# Patient Record
Sex: Female | Born: 1965
Health system: Southern US, Community
[De-identification: ages and names within clinical notes are randomized; demographics above are authoritative.]

## PROBLEM LIST (undated history)

## (undated) DIAGNOSIS — D649 Anemia, unspecified: Secondary | ICD-10-CM

## (undated) DIAGNOSIS — F419 Anxiety disorder, unspecified: Secondary | ICD-10-CM

## (undated) DIAGNOSIS — I10 Essential (primary) hypertension: Secondary | ICD-10-CM

## (undated) HISTORY — DX: Essential (primary) hypertension: I10

## (undated) HISTORY — PX: CHOLECYSTECTOMY: SHX55

## (undated) HISTORY — DX: Anxiety disorder, unspecified: F41.9

---
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
## 2005-04-06 ENCOUNTER — Ambulatory Visit: Payer: Self-pay | Admitting: Unknown Physician Specialty

## 2005-07-20 ENCOUNTER — Ambulatory Visit: Payer: Self-pay | Admitting: Family Medicine

## 2005-07-29 ENCOUNTER — Ambulatory Visit: Payer: Self-pay | Admitting: Urology

## 2006-06-26 DIAGNOSIS — G243 Spasmodic torticollis: Secondary | ICD-10-CM | POA: Insufficient documentation

## 2006-07-07 ENCOUNTER — Encounter: Payer: Self-pay | Admitting: Family Medicine

## 2006-07-14 ENCOUNTER — Ambulatory Visit: Payer: Self-pay | Admitting: Family Medicine

## 2006-07-16 ENCOUNTER — Encounter: Payer: Self-pay | Admitting: Family Medicine

## 2006-08-01 ENCOUNTER — Ambulatory Visit: Payer: Self-pay | Admitting: Unknown Physician Specialty

## 2007-08-14 ENCOUNTER — Ambulatory Visit: Payer: Self-pay | Admitting: Unknown Physician Specialty

## 2008-10-01 ENCOUNTER — Ambulatory Visit: Payer: Self-pay | Admitting: Unknown Physician Specialty

## 2009-10-05 ENCOUNTER — Ambulatory Visit: Payer: Self-pay | Admitting: Unknown Physician Specialty

## 2010-10-19 ENCOUNTER — Ambulatory Visit: Payer: Self-pay | Admitting: Unknown Physician Specialty

## 2012-08-08 LAB — TSH: TSH: 3.46 u[IU]/mL (ref 0.41–5.90)

## 2012-08-08 LAB — BASIC METABOLIC PANEL
BUN: 8 mg/dL (ref 4–21)
CREATININE: 0.9 mg/dL (ref 0.5–1.1)
GLUCOSE: 97 mg/dL
Potassium: 4.5 mmol/L (ref 3.4–5.3)
Sodium: 140 mmol/L (ref 137–147)

## 2012-08-08 LAB — LIPID PANEL
Cholesterol: 222 mg/dL — AB (ref 0–200)
HDL: 74 mg/dL — AB (ref 35–70)
LDL CALC: 130 mg/dL
TRIGLYCERIDES: 89 mg/dL (ref 40–160)

## 2012-08-08 LAB — CBC AND DIFFERENTIAL
HCT: 36 % (ref 36–46)
Hemoglobin: 12.2 g/dL (ref 12.0–16.0)
Platelets: 216 10*3/uL (ref 150–399)
WBC: 4 10^3/mL

## 2014-11-20 ENCOUNTER — Ambulatory Visit (INDEPENDENT_AMBULATORY_CARE_PROVIDER_SITE_OTHER): Payer: BLUE CROSS/BLUE SHIELD

## 2014-11-20 DIAGNOSIS — Z23 Encounter for immunization: Secondary | ICD-10-CM

## 2014-11-21 ENCOUNTER — Other Ambulatory Visit: Payer: Self-pay | Admitting: Family Medicine

## 2014-12-11 LAB — HM PAP SMEAR

## 2015-01-02 ENCOUNTER — Encounter: Payer: Self-pay | Admitting: Family Medicine

## 2015-01-05 DIAGNOSIS — F419 Anxiety disorder, unspecified: Secondary | ICD-10-CM | POA: Insufficient documentation

## 2015-01-05 DIAGNOSIS — F329 Major depressive disorder, single episode, unspecified: Secondary | ICD-10-CM | POA: Insufficient documentation

## 2015-01-05 DIAGNOSIS — D649 Anemia, unspecified: Secondary | ICD-10-CM | POA: Insufficient documentation

## 2015-01-05 DIAGNOSIS — F32A Depression, unspecified: Secondary | ICD-10-CM | POA: Insufficient documentation

## 2015-01-05 DIAGNOSIS — J309 Allergic rhinitis, unspecified: Secondary | ICD-10-CM | POA: Insufficient documentation

## 2015-01-06 ENCOUNTER — Ambulatory Visit (INDEPENDENT_AMBULATORY_CARE_PROVIDER_SITE_OTHER): Payer: BLUE CROSS/BLUE SHIELD | Admitting: Family Medicine

## 2015-01-06 ENCOUNTER — Encounter: Payer: Self-pay | Admitting: Family Medicine

## 2015-01-06 VITALS — BP 180/88 | HR 128 | Resp 18 | Wt 148.0 lb

## 2015-01-06 DIAGNOSIS — N951 Menopausal and female climacteric states: Secondary | ICD-10-CM | POA: Diagnosis not present

## 2015-01-06 DIAGNOSIS — F419 Anxiety disorder, unspecified: Secondary | ICD-10-CM

## 2015-01-06 DIAGNOSIS — G243 Spasmodic torticollis: Secondary | ICD-10-CM | POA: Diagnosis not present

## 2015-01-06 DIAGNOSIS — R000 Tachycardia, unspecified: Secondary | ICD-10-CM

## 2015-01-06 DIAGNOSIS — I10 Essential (primary) hypertension: Secondary | ICD-10-CM | POA: Diagnosis not present

## 2015-01-06 MED ORDER — CYCLOBENZAPRINE HCL 10 MG PO TABS: 10.0000 mg | ORAL_TABLET | Freq: Every day | ORAL | Status: DC

## 2015-01-06 MED ORDER — ALPRAZOLAM 0.25 MG PO TABS: 0.2500 mg | ORAL_TABLET | Freq: Two times a day (BID) | ORAL | Status: DC | PRN

## 2015-01-06 MED ORDER — PAROXETINE HCL 20 MG PO TABS: 20.0000 mg | ORAL_TABLET | Freq: Every day | ORAL | Status: DC

## 2015-01-06 NOTE — Progress Notes (Signed)
Patient ID: Catherine Herrera, female   DOB: 05-31-1965, 49 y.o.   MRN: [id number]    Subjective:  HPI  Patient is here for follow up. Last visit was October 2015.  Anxiety: Patient has been taking Xanax 1 tablet daily tries not to take 2 tablets. Symptoms are the same-anxiety, sometimes gets palpitations. Sleeping ok. She has been using Flexeril daily to help with next stiffness/tension. She wanted to discuss thyroid issues.  Hypertension: Patient gets elevated B/P readings when at the doctors office. She has been checking B/P and some of the readings have been as follows: 119/86, 142/80.131/86,122/78.  BP Readings from Last 3 Encounters:  01/06/15 180/88  11/19/13 158/84    Prior to Admission medications   Medication Sig Start Date End Date Taking? Authorizing Provider  ALPRAZolam Duanne Moron) 0.25 MG tablet Take by mouth. 05/28/14  Yes Historical Provider, MD  cyclobenzaprine (FLEXERIL) 10 MG tablet Take by mouth. 11/19/13  Yes Historical Provider, MD    Patient Active Problem List   Diagnosis Date Noted  . Absolute anemia 01/05/2015  . Anxiety 01/05/2015  . Allergic rhinitis 01/05/2015  . Clinical depression 01/05/2015  . Spasmodic torticollis 06/26/2006    No past medical history on file.  Social History   Social History  . Marital Status: Married    Spouse Name: N/A  . Number of Children: N/A  . Years of Education: N/A   Occupational History  . Not on file.   Social History Main Topics  . Smoking status: Never Smoker   . Smokeless tobacco: Never Used  . Alcohol Use: Yes  . Drug Use: No  . Sexual Activity: Not on file   Other Topics Concern  . Not on file   Social History Narrative    No Known Allergies  Review of Systems  Constitutional: Negative.   HENT: Negative.   Eyes: Negative.   Respiratory: Negative.   Cardiovascular: Positive for palpitations. Negative for chest pain, orthopnea and leg swelling.  Gastrointestinal: Negative.   Musculoskeletal:  Positive for myalgias and neck pain.  Skin: Negative.   Neurological: Negative.   Psychiatric/Behavioral: The patient is nervous/anxious.     Immunization History  Administered Date(s) Administered  . Influenza,inj,Quad PF,36+ Mos 11/20/2014  . Tdap 01/26/2012   Objective:  BP 180/88 mmHg  Pulse 128  Resp 18  Wt 148 lb (67.132 kg)  Physical Exam  Constitutional: She is oriented to person, place, and time and well-developed, well-nourished, and in no distress.  HENT:  Head: Normocephalic and atraumatic.  Eyes: Conjunctivae are normal. Pupils are equal, round, and reactive to light.  Neck: Neck supple. No thyromegaly present.  Cardiovascular: Normal rate, regular rhythm, normal heart sounds and intact distal pulses.   No murmur heard. Pulmonary/Chest: Effort normal and breath sounds normal. No respiratory distress. She has no wheezes. She has no rales.  Musculoskeletal: Normal range of motion. She exhibits no edema or tenderness.  Neurological: She is alert and oriented to person, place, and time.  Skin: Skin is warm and dry.  Psychiatric: Mood, memory, affect and judgment normal.    Lab Results  Component Value Date   WBC 4.0 08/08/2012   HGB 12.2 08/08/2012   HCT 36 08/08/2012   PLT 216 08/08/2012   CHOL 222* 08/08/2012   TRIG 89 08/08/2012   HDL 74* 08/08/2012   LDLCALC 130 08/08/2012   TSH 3.46 08/08/2012    CMP     Component Value Date/Time   NA 140 08/08/2012   K  4.5 08/08/2012   BUN 8 08/08/2012   CREATININE 0.9 08/08/2012    Assessment and Plan :  1. Anxiety Not better. Refill provided. Will add Paroxetine. Re check on the next visit. - ALPRAZolam (XANAX) 0.25 MG tablet; Take 1 tablet (0.25 mg total) by mouth 2 (two) times daily as needed for anxiety.  Dispense: 60 tablet; Refill: 5 - cyclobenzaprine (FLEXERIL) 10 MG tablet; Take 1 tablet (10 mg total) by mouth at bedtime.  Dispense: 30 tablet; Refill: 5 - CBC with Differential/Platelet - TSH -  Comprehensive metabolic panel  2. Spasmodic torticollis  3. Menopausal symptom Will check hormone levels. - Comprehensive metabolic panel - FSH - LH  4. Hypertension Seems to be "white coat hypertension". Patient brought in readings that she has gotten on her own-will re check readings on the next visit, I think this is anxiety related.  If this does not improve may need to add medication.   Patient was seen and examined by Dr. Eulas Post and note was scribed by Theressa Millard, RMA.   Miguel Aschoff MD [facility] 01/06/2015 3:51 PM

## 2015-01-07 LAB — COMPREHENSIVE METABOLIC PANEL
ALBUMIN: 4.3 g/dL (ref 3.5–5.5)
ALT: 8 IU/L (ref 0–32)
AST: 18 IU/L (ref 0–40)
Albumin/Globulin Ratio: 1.1 (ref 1.1–2.5)
Alkaline Phosphatase: 60 IU/L (ref 39–117)
BUN / CREAT RATIO: 11 (ref 9–23)
BUN: 10 mg/dL (ref 6–24)
Bilirubin Total: <0.2 mg/dL (ref 0.0–1.2)
CALCIUM: 9.4 mg/dL (ref 8.7–10.2)
CO2: 23 mmol/L (ref 18–29)
CREATININE: 0.91 mg/dL (ref 0.57–1.00)
Chloride: 99 mmol/L (ref 97–106)
GFR, EST AFRICAN AMERICAN: 85 mL/min/{1.73_m2} (ref >59)
GFR, EST NON AFRICAN AMERICAN: 74 mL/min/{1.73_m2} (ref >59)
GLOBULIN, TOTAL: 3.8 g/dL (ref 1.5–4.5)
Glucose: 107 mg/dL — ABNORMAL HIGH (ref 65–99)
Potassium: 3.8 mmol/L (ref 3.5–5.2)
SODIUM: 139 mmol/L (ref 136–144)
Total Protein: 8.1 g/dL (ref 6.0–8.5)

## 2015-01-07 LAB — CBC WITH DIFFERENTIAL/PLATELET
BASOS: 0 %
Basophils Absolute: 0 10*3/uL (ref 0.0–0.2)
EOS (ABSOLUTE): 0 10*3/uL (ref 0.0–0.4)
EOS: 1 %
HEMATOCRIT: 28.3 % — AB (ref 34.0–46.6)
Hemoglobin: 8.6 g/dL — ABNORMAL LOW (ref 11.1–15.9)
IMMATURE GRANS (ABS): 0 10*3/uL (ref 0.0–0.1)
IMMATURE GRANULOCYTES: 0 %
Lymphocytes Absolute: 1 10*3/uL (ref 0.7–3.1)
Lymphs: 18 %
MCH: 23.5 pg — ABNORMAL LOW (ref 26.6–33.0)
MCHC: 30.4 g/dL — ABNORMAL LOW (ref 31.5–35.7)
MCV: 77 fL — AB (ref 79–97)
MONOS ABS: 0.6 10*3/uL (ref 0.1–0.9)
Monocytes: 11 %
NEUTROS PCT: 70 %
Neutrophils Absolute: 3.8 10*3/uL (ref 1.4–7.0)
Platelets: 326 10*3/uL (ref 150–379)
RBC: 3.66 x10E6/uL — ABNORMAL LOW (ref 3.77–5.28)
RDW: 16.2 % — AB (ref 12.3–15.4)
WBC: 5.4 10*3/uL (ref 3.4–10.8)

## 2015-01-07 LAB — FOLLICLE STIMULATING HORMONE: FSH: 8.1 m[IU]/mL

## 2015-01-07 LAB — TSH: TSH: 3.82 u[IU]/mL (ref 0.450–4.500)

## 2015-01-07 LAB — LUTEINIZING HORMONE: LH: 6 m[IU]/mL

## 2015-01-12 ENCOUNTER — Telehealth: Payer: Self-pay

## 2015-01-12 NOTE — Telephone Encounter (Signed)
-----   Message from Jerrol Banana., MD sent at 01/07/2015 11:11 AM EST ----- Anemic. This patient still having menstrual cycles ? Keep follow-up in a couple weeks. Is patient having any GI bleeding that she notices. Any GI symptoms ?

## 2015-01-12 NOTE — Telephone Encounter (Signed)
Left message to call back  

## 2015-01-13 NOTE — Telephone Encounter (Signed)
Keep follow-up in December.

## 2015-01-13 NOTE — Telephone Encounter (Signed)
Pt advised-aa 

## 2015-01-13 NOTE — Telephone Encounter (Signed)
Patient reports that she does have heavy menstrual cycles. She reports that GYN has treated her anemia before in the past and started her on iron supplements. She reports that she has been off of supplements for years. Denies any GI bleeding or symptoms.

## 2015-01-26 ENCOUNTER — Ambulatory Visit (INDEPENDENT_AMBULATORY_CARE_PROVIDER_SITE_OTHER): Payer: BLUE CROSS/BLUE SHIELD | Admitting: Family Medicine

## 2015-01-26 VITALS — BP 160/88 | HR 100 | Temp 98.3°F | Resp 16 | Wt 146.0 lb

## 2015-01-26 DIAGNOSIS — I10 Essential (primary) hypertension: Secondary | ICD-10-CM | POA: Diagnosis not present

## 2015-01-26 DIAGNOSIS — F419 Anxiety disorder, unspecified: Secondary | ICD-10-CM

## 2015-01-26 DIAGNOSIS — D649 Anemia, unspecified: Secondary | ICD-10-CM

## 2015-01-26 MED ORDER — METOPROLOL SUCCINATE ER 25 MG PO TB24: 25.0000 mg | ORAL_TABLET | Freq: Every day | ORAL | Status: DC

## 2015-01-26 NOTE — Progress Notes (Signed)
Patient ID: Catherine Herrera, female   DOB: 1965/08/27, 49 y.o.   MRN: [id number]   FRANNY DEBAKER  MRN: [id number] DOB: 12/08/1965  Subjective:  HPI   1. Anxiety The patient is a 49 year old female who presents for follow up of her anxiety.  Her last visit was on 01/06/15.  At that time she was started on Paroxetine.  She states that she sometimes has trouble remembering to take it and on the nights that she does take is she does notice being drowsy the next day.  When asked if she has noticed any improvement in her symptoms she said maybe a little.    2. Essential hypertension Patient gets good reading outside of the office and on her last visit she was told she probably has some white coat component.  3. Anemia, unspecified anemia type When the patient was in the office in November she had labs done.  It was noted that she was anemic.  The patient states that she has had trouble with anemia for several years.  She said that she has been put on supplement in the past and then when the level comes up she is taken off of the supplements.  The patient does note that she has very heavy periods and thinks this is a contributing factor to why her levels keep going up and down.    Patient Active Problem List   Diagnosis Date Noted  . Absolute anemia 01/05/2015  . Anxiety 01/05/2015  . Allergic rhinitis 01/05/2015  . Clinical depression 01/05/2015  . Spasmodic torticollis 06/26/2006    No past medical history on file.  Social History   Social History  . Marital Status: Married    Spouse Name: N/A  . Number of Children: N/A  . Years of Education: N/A   Occupational History  . Not on file.   Social History Main Topics  . Smoking status: Never Smoker   . Smokeless tobacco: Never Used  . Alcohol Use: Yes  . Drug Use: No  . Sexual Activity: Not on file   Other Topics Concern  . Not on file   Social History Narrative    Outpatient Prescriptions Prior to Visit    Medication Sig Dispense Refill  . ALPRAZolam (XANAX) 0.25 MG tablet Take 1 tablet (0.25 mg total) by mouth 2 (two) times daily as needed for anxiety. 60 tablet 5  . cyclobenzaprine (FLEXERIL) 10 MG tablet Take 1 tablet (10 mg total) by mouth at bedtime. 30 tablet 5  . PARoxetine (PAXIL) 20 MG tablet Take 1 tablet (20 mg total) by mouth daily. 30 tablet 5   No facility-administered medications prior to visit.    No Known Allergies  Review of Systems  Constitutional: Negative for fever, chills and malaise/fatigue.  Respiratory: Negative for cough, hemoptysis, sputum production, shortness of breath and wheezing.   Cardiovascular: Negative for chest pain, palpitations, orthopnea and leg swelling.  Neurological: Negative for weakness and headaches.  Endo/Heme/Allergies: Does not bruise/bleed easily.  Psychiatric/Behavioral: Negative for depression, suicidal ideas, hallucinations and memory loss. The patient is nervous/anxious. The patient does not have insomnia.    Objective:  BP 160/88 mmHg  Pulse 100  Temp(Src) 98.3 F (36.8 C) (Oral)  Resp 16  Wt 146 lb (66.225 kg)  Physical Exam  Constitutional: She is oriented to person, place, and time and well-developed, well-nourished, and in no distress.  HENT:  Head: Normocephalic and atraumatic.  Right Ear: External ear normal.  Left Ear: External  ear normal.  Nose: Nose normal.  Mouth/Throat: Oropharynx is clear and moist.  Eyes: Conjunctivae are normal. Pupils are equal, round, and reactive to light.  Slightly pale conjuctiva.  Neck: Normal range of motion. Neck supple.  Very mild torticollis noted  Cardiovascular: Regular rhythm and normal heart sounds.   Tachycardic   Pulmonary/Chest: Effort normal and breath sounds normal.  Abdominal: Soft.  Neurological: She is alert and oriented to person, place, and time.  Skin: Skin is warm and dry.  Psychiatric: Mood, memory, affect and judgment normal.    Assessment and Plan :   1.  Anxiety Patient will continue her Paroxetine.  2. Essential hypertension After consideration will go ahead and treat for hypertension. - metoprolol succinate (TOPROL-XL) 25 MG 24 hr tablet; Take 1 tablet (25 mg total) by mouth daily.  Dispense: 30 tablet; Refill: 12 Follow up in 1 month  3. Anemia, unspecified anemia type I think this is due to menorrhagia but must consider a GI workup. At any rate, it is time for screening colonoscopy and 49 year old. - Ambulatory referral to Gastroenterology - CBC With Differential/Platelet - Iron - Iron Binding Cap (TIBC) - Ferritin 4. Chronic torticollis I have done the exam and reviewed the above chart and it is accurate to the best of my knowledge.  Miguel Aschoff MD [facility] 01/26/2015 3:12 PM

## 2015-01-29 LAB — CBC WITH DIFFERENTIAL/PLATELET

## 2015-01-29 LAB — FERRITIN: Ferritin: 5 ng/mL — ABNORMAL LOW (ref 15–150)

## 2015-01-29 LAB — IRON AND TIBC
IRON: 23 ug/dL — AB (ref 27–159)
Iron Saturation: 6 % — CL (ref 15–55)
Total Iron Binding Capacity: 398 ug/dL (ref 250–450)
UIBC: 375 ug/dL (ref 131–425)

## 2015-01-30 ENCOUNTER — Telehealth: Payer: Self-pay | Admitting: Gastroenterology

## 2015-01-30 NOTE — Telephone Encounter (Signed)
I have called pt to make an appointment per referral for GI--Anemia. No answer. I have left a message on voicemail.

## 2015-02-23 ENCOUNTER — Ambulatory Visit: Payer: BLUE CROSS/BLUE SHIELD | Admitting: Family Medicine

## 2015-03-04 ENCOUNTER — Ambulatory Visit (INDEPENDENT_AMBULATORY_CARE_PROVIDER_SITE_OTHER): Payer: BLUE CROSS/BLUE SHIELD | Admitting: Gastroenterology

## 2015-03-04 ENCOUNTER — Encounter: Payer: Self-pay | Admitting: Gastroenterology

## 2015-03-04 ENCOUNTER — Other Ambulatory Visit: Payer: Self-pay

## 2015-03-04 VITALS — BP 168/97 | HR 119 | Temp 98.8°F | Ht 66.0 in | Wt 148.0 lb

## 2015-03-04 DIAGNOSIS — D509 Iron deficiency anemia, unspecified: Secondary | ICD-10-CM

## 2015-03-04 NOTE — Progress Notes (Signed)
Gastroenterology Consultation  Referring Provider:     Jerrol Banana.,* Primary Care Physician:  [name], MD Primary Gastroenterologist:  Dr. Allen Norris     Reason for Consultation:     Anemia        HPI:   Catherine Herrera is a 50 y.o. y/o female referred for consultation & management of anemia by Dr. [name], MD.  This patient comes today with a history of anemia. She also reports that she has a family history of anemia. The patient has had peri-8. Throughout her life and it was a was thought that her anemia was due to this. The patient denies any abdominal pain black stools or bloody stools. She also denies any other GI symptoms except for having a history of alternating diarrhea and constipation the past. She states that this is been going on for many years and is not a change. The patient also reports that she has been on iron off and on with response from iron treatment. There is no family history of colon cancer colon polyps that the patient reports. The patient does have a history of recent blood work that shows a low iron count and low ferritin.  Past Medical History  Diagnosis Date  . Anxiety   . Hypertension     Past Surgical History  Procedure Laterality Date  . Cholecystectomy      Prior to Admission medications   Medication Sig Start Date End Date Taking? Authorizing Provider  ALPRAZolam (XANAX) 0.25 MG tablet Take 1 tablet (0.25 mg total) by mouth 2 (two) times daily as needed for anxiety. 01/06/15  Yes Richard Maceo Pro., MD  cyclobenzaprine (FLEXERIL) 10 MG tablet Take 1 tablet (10 mg total) by mouth at bedtime. 01/06/15  Yes Richard Maceo Pro., MD  Fe Fum-FePoly-FA-Vit C-Vit B3 (INTEGRA F) 125-1 MG CAPS Take by mouth.   Yes Historical Provider, MD  metoprolol succinate (TOPROL-XL) 25 MG 24 hr tablet Take 1 tablet (25 mg total) by mouth daily. 01/26/15  Yes Richard Maceo Pro., MD  PARoxetine (PAXIL) 20 MG tablet Take 1 tablet (20 mg  total) by mouth daily. Patient not taking: Reported on 03/04/2015 01/06/15   Jerrol Banana., MD    Family History  Problem Relation Age of Onset  . Hypertension Mother   . Hypothyroidism Mother   . Hypertension Father   . Prostate cancer Father   . Hyperthyroidism Sister   . Multiple sclerosis Maternal Grandmother   . Heart attack Maternal Grandfather   . Heart disease Maternal Grandfather   . Gallbladder disease Sister      Social History  Substance Use Topics  . Smoking status: Never Smoker   . Smokeless tobacco: Never Used  . Alcohol Use: Yes    Allergies as of 03/04/2015  . (No Known Allergies)    Review of Systems:    All systems reviewed and negative except where noted in HPI.   Physical Exam:  BP 168/97 mmHg  Pulse 119  Temp(Src) 98.8 F (37.1 C) (Oral)  Ht 5\' 6"  (1.676 m)  Wt 148 lb (67.132 kg)  BMI 23.90 kg/m2 No LMP recorded. Psych:  Alert and cooperative. Normal mood and affect. General:   Alert,  Well-developed, well-nourished, pleasant and cooperative in NAD Head:  Normocephalic and atraumatic. Eyes:  Sclera clear, no icterus.   Conjunctiva pink. Ears:  Normal auditory acuity. Nose:  No deformity, discharge, or lesions. Mouth:  No deformity  or lesions,oropharynx pink & moist. Neck:  Supple; no masses or thyromegaly. Lungs:  Respirations even and unlabored.  Clear throughout to auscultation.   No wheezes, crackles, or rhonchi. No acute distress. Heart:  Regular rate and rhythm; no murmurs, clicks, rubs, or gallops. Abdomen:  Normal bowel sounds.  No bruits.  Soft, non-tender and non-distended without masses, hepatosplenomegaly or hernias noted.  No guarding or rebound tenderness.  Negative Carnett sign.   Rectal:  Deferred.  Msk:  Symmetrical without gross deformities.  Good, equal movement & strength bilaterally. Pulses:  Normal pulses noted. Extremities:  No clubbing or edema.  No cyanosis. Neurologic:  Alert and oriented x3;  grossly normal  neurologically. Skin:  Intact without significant lesions or rashes.  No jaundice. Lymph Nodes:  No significant cervical adenopathy. Psych:  Alert and cooperative. Normal mood and affect.  Imaging Studies: No results found.  Assessment and Plan:   Catherine Herrera is a 50 y.o. y/o female who comes in today with a history of heavy periods during her cycle. The patient has had anemia from this in the past. At this time the patient will continue her iron treatment and she states that she is starting to go through menopause. I have instructed her that it is time for her screening colonoscopy and she should undergo a screening colonoscopy. If her anemia persists after menopause then she may need a further workup with an upper endoscopy and/or capsule endoscopy of the small bowel. As for now the patient will be set up for the screening colonoscopy. I have discussed risks & benefits which include, but are not limited to, bleeding, infection, perforation & drug reaction.  The patient agrees with this plan & written consent will be obtained.      Note: This dictation was prepared with Dragon dictation along with smaller phrase technology. Any transcriptional errors that result from this process are unintentional.

## 2015-03-09 ENCOUNTER — Ambulatory Visit: Payer: BLUE CROSS/BLUE SHIELD | Admitting: Gastroenterology

## 2015-03-19 NOTE — Discharge Instructions (Signed)

## 2015-03-23 ENCOUNTER — Ambulatory Visit: Payer: BLUE CROSS/BLUE SHIELD | Admitting: Anesthesiology

## 2015-03-23 ENCOUNTER — Other Ambulatory Visit: Payer: Self-pay | Admitting: Gastroenterology

## 2015-03-23 ENCOUNTER — Ambulatory Visit
Admission: RE | Admit: 2015-03-23 | Discharge: 2015-03-23 | Disposition: A | Discharge status: Home / Self Care | Payer: BLUE CROSS/BLUE SHIELD | Source: Ambulatory Visit | Attending: Gastroenterology | Admitting: Gastroenterology

## 2015-03-23 ENCOUNTER — Encounter
Admission: RE | Disposition: A | Discharge status: Home / Self Care | Payer: Self-pay | Source: Ambulatory Visit | Attending: Gastroenterology

## 2015-03-23 DIAGNOSIS — Z8249 Family history of ischemic heart disease and other diseases of the circulatory system: Secondary | ICD-10-CM | POA: Insufficient documentation

## 2015-03-23 DIAGNOSIS — Z1211 Encounter for screening for malignant neoplasm of colon: Secondary | ICD-10-CM | POA: Diagnosis present

## 2015-03-23 DIAGNOSIS — F419 Anxiety disorder, unspecified: Secondary | ICD-10-CM | POA: Insufficient documentation

## 2015-03-23 DIAGNOSIS — I10 Essential (primary) hypertension: Secondary | ICD-10-CM | POA: Diagnosis not present

## 2015-03-23 DIAGNOSIS — Z79899 Other long term (current) drug therapy: Secondary | ICD-10-CM | POA: Insufficient documentation

## 2015-03-23 DIAGNOSIS — Z820 Family history of epilepsy and other diseases of the nervous system: Secondary | ICD-10-CM | POA: Diagnosis not present

## 2015-03-23 DIAGNOSIS — D649 Anemia, unspecified: Secondary | ICD-10-CM | POA: Insufficient documentation

## 2015-03-23 DIAGNOSIS — Z8042 Family history of malignant neoplasm of prostate: Secondary | ICD-10-CM | POA: Insufficient documentation

## 2015-03-23 DIAGNOSIS — Z8349 Family history of other endocrine, nutritional and metabolic diseases: Secondary | ICD-10-CM | POA: Diagnosis not present

## 2015-03-23 DIAGNOSIS — D125 Benign neoplasm of sigmoid colon: Secondary | ICD-10-CM | POA: Diagnosis not present

## 2015-03-23 HISTORY — DX: Anemia, unspecified: D64.9

## 2015-03-23 HISTORY — PX: COLONOSCOPY WITH PROPOFOL: SHX5780

## 2015-03-23 HISTORY — PX: POLYPECTOMY: SHX5525

## 2015-03-23 SURGERY — COLONOSCOPY WITH PROPOFOL
Anesthesia: Monitor Anesthesia Care

## 2015-03-23 MED ORDER — ACETAMINOPHEN 325 MG PO TABS: 325.0000 mg | ORAL_TABLET | ORAL | Status: DC | PRN

## 2015-03-23 MED ORDER — ACETAMINOPHEN 160 MG/5ML PO SOLN: 325.0000 mg | ORAL | Status: DC | PRN

## 2015-03-23 MED ORDER — PROPOFOL 10 MG/ML IV BOLUS
INTRAVENOUS | Status: DC | PRN
  Administered 2015-03-23 (×8): 20 mg via INTRAVENOUS

## 2015-03-23 MED ORDER — LACTATED RINGERS IV SOLN
INTRAVENOUS | Status: DC
  Administered 2015-03-23: 09:00:00 via INTRAVENOUS

## 2015-03-23 MED ORDER — STERILE WATER FOR IRRIGATION IR SOLN
Status: DC | PRN
  Administered 2015-03-23: 09:00:00

## 2015-03-23 SURGICAL SUPPLY — 28 items

## 2015-03-23 NOTE — H&P (Signed)
  [facility]  [address], [place], [place] [address] Phone: [phone] Fax : [phone]  Primary Care Physician:  [name], MD Primary Gastroenterologist:  Dr. Allen Norris  Pre-Procedure History & Physical: HPI:  Catherine Herrera is a 50 y.o. female is here for a screening colonoscopy.   Past Medical History  Diagnosis Date  . Anxiety   . Hypertension   . Anemia     Past Surgical History  Procedure Laterality Date  . Cholecystectomy      Prior to Admission medications   Medication Sig Start Date End Date Taking? Authorizing Provider  ALPRAZolam (XANAX) 0.25 MG tablet Take 1 tablet (0.25 mg total) by mouth 2 (two) times daily as needed for anxiety. 01/06/15  Yes Richard Maceo Pro., MD  cyclobenzaprine (FLEXERIL) 10 MG tablet Take 1 tablet (10 mg total) by mouth at bedtime. 01/06/15  Yes Richard Maceo Pro., MD  Fe Fum-FePoly-FA-Vit C-Vit B3 (INTEGRA F) 125-1 MG CAPS Take by mouth.   Yes Historical Provider, MD  metoprolol succinate (TOPROL-XL) 25 MG 24 hr tablet Take 1 tablet (25 mg total) by mouth daily. 01/26/15  Yes Richard Maceo Pro., MD  PARoxetine (PAXIL) 20 MG tablet Take 1 tablet (20 mg total) by mouth daily. Patient not taking: Reported on 03/04/2015 01/06/15   Jerrol Banana., MD    Allergies as of 03/04/2015  . (No Known Allergies)    Family History  Problem Relation Age of Onset  . Hypertension Mother   . Hypothyroidism Mother   . Hypertension Father   . Prostate cancer Father   . Hyperthyroidism Sister   . Multiple sclerosis Maternal Grandmother   . Heart attack Maternal Grandfather   . Heart disease Maternal Grandfather   . Gallbladder disease Sister     Social History   Social History  . Marital Status: Married    Spouse Name: N/A  . Number of Children: N/A  . Years of Education: N/A   Occupational History  . Not on file.   Social History Main Topics  . Smoking status: Never Smoker   . Smokeless  tobacco: Never Used  . Alcohol Use: Yes  . Drug Use: No  . Sexual Activity: Not on file   Other Topics Concern  . Not on file   Social History Narrative    Review of Systems: See HPI, otherwise negative ROS  Physical Exam: BP 143/90 mmHg  Pulse 97  Temp(Src) 98.1 F (36.7 C) (Temporal)  Resp 16  Ht 5\' 6"  (1.676 m)  Wt 147 lb (66.679 kg)  BMI 23.74 kg/m2  SpO2 100%  LMP 01/09/2015 General:   Alert,  pleasant and cooperative in NAD Head:  Normocephalic and atraumatic. Neck:  Supple; no masses or thyromegaly. Lungs:  Clear throughout to auscultation.    Heart:  Regular rate and rhythm. Abdomen:  Soft, nontender and nondistended. Normal bowel sounds, without guarding, and without rebound.   Neurologic:  Alert and  oriented x4;  grossly normal neurologically.  Impression/Plan: [name] is now here to undergo a screening colonoscopy.  Risks, benefits, and alternatives regarding colonoscopy have been reviewed with the patient.  Questions have been answered.  All parties agreeable.

## 2015-03-23 NOTE — Transfer of Care (Signed)
Immediate Anesthesia Transfer of Care Note  Patient: Catherine Herrera  Procedure(s) Performed: Procedure(s): COLONOSCOPY WITH PROPOFOL (N/A) POLYPECTOMY  Patient Location: PACU  Anesthesia Type: MAC  Level of Consciousness: awake, alert  and patient cooperative  Airway and Oxygen Therapy: Patient Spontanous Breathing and Patient connected to supplemental oxygen  Post-op Assessment: Post-op Vital signs reviewed, Patient's Cardiovascular Status Stable, Respiratory Function Stable, Patent Airway and No signs of Nausea or vomiting  Post-op Vital Signs: Reviewed and stable  Complications: No apparent anesthesia complications

## 2015-03-23 NOTE — Anesthesia Postprocedure Evaluation (Signed)
Anesthesia Post Note  Patient: Catherine Herrera  Procedure(s) Performed: Procedure(s) (LRB): COLONOSCOPY WITH PROPOFOL (N/A) POLYPECTOMY  Patient location during evaluation: PACU Anesthesia Type: MAC Level of consciousness: awake and alert and oriented Pain management: satisfactory to patient Vital Signs Assessment: post-procedure vital signs reviewed and stable Respiratory status: spontaneous breathing, nonlabored ventilation and respiratory function stable Cardiovascular status: blood pressure returned to baseline and stable Postop Assessment: Adequate PO intake and No signs of nausea or vomiting Anesthetic complications: no    Raliegh Ip

## 2015-03-23 NOTE — Op Note (Signed)
[facility] Gastroenterology Patient Name: Catherine Herrera Procedure Date: 03/23/2015 9:13 AM MRN: [id number] Account #: [id number] Date of Birth: 11/01/1965 Admit Type: Outpatient Age: 50 Room: [facility] OR ROOM 01 Gender: Female Note Status: Finalized Procedure:         Colonoscopy Indications:       Screening for colorectal malignant neoplasm Providers:         Lucilla Lame, MD Referring MD:      Janine Ores. Rosanna Randy, MD (Referring MD) Medicines:         Propofol per Anesthesia Complications:     No immediate complications. Procedure:         Pre-Anesthesia Assessment:                    - Prior to the procedure, a History and Physical was                     performed, and patient medications and allergies were                     reviewed. The patient's tolerance of previous anesthesia                     was also reviewed. The risks and benefits of the procedure                     and the sedation options and risks were discussed with the                     patient. All questions were answered, and informed consent                     was obtained. Prior Anticoagulants: The patient has taken                     no previous anticoagulant or antiplatelet agents. ASA                     Grade Assessment: II - A patient with mild systemic                     disease. After reviewing the risks and benefits, the                     patient was deemed in satisfactory condition to undergo                     the procedure.                    After obtaining informed consent, the colonoscope was                     passed under direct vision. Throughout the procedure, the                     patient's blood pressure, pulse, and oxygen saturations                     were monitored continuously. The was introduced through                     the anus and advanced to the the terminal ileum. The  colonoscopy was performed without difficulty. The patient                    tolerated the procedure well. The quality of the bowel                     preparation was excellent. Findings:      The perianal and digital rectal examinations were normal.      Non-bleeding internal hemorrhoids were found during retroflexion. The       hemorrhoids were Grade I (internal hemorrhoids that do not prolapse).      Two sessile polyps were found in the sigmoid colon. The polyps were 2 to       3 mm in size. These polyps were removed with a cold biopsy forceps.       Resection and retrieval were complete.      The terminal ileum appeared normal. Impression:        - Non-bleeding internal hemorrhoids.                    - Two 2 to 3 mm polyps in the sigmoid colon. Resected and                     retrieved.                    - The examined portion of the ileum was normal. Recommendation:    - Await pathology results.                    - Repeat colonoscopy in 5 years if polyp adenoma and 10                     years if hyperplastic Procedure Code(s): --- Professional ---                    [phone], Colonoscopy, flexible; with biopsy, single or                     multiple Diagnosis Code(s): --- Professional ---                    Z12.11, Encounter for screening for malignant neoplasm of                     colon                    D12.5, Benign neoplasm of sigmoid colon CPT copyright 2014 [employer]. All rights reserved. The codes documented in this report are preliminary and upon coder review may  be revised to meet current compliance requirements. Lucilla Lame, MD 03/23/2015 9:43:32 AM This report has been signed electronically. Number of Addenda: 0 Note Initiated On: 03/23/2015 9:13 AM Scope Withdrawal Time: 0 hours 10 minutes 29 seconds  Total Procedure Duration: 0 hours 15 minutes 44 seconds       [facility]

## 2015-03-23 NOTE — Anesthesia Preprocedure Evaluation (Signed)
Anesthesia Evaluation  Patient identified by MRN, date of birth, ID band  Reviewed: Allergy & Precautions, H&P , NPO status , Patient's Chart, lab work & pertinent test results  Airway Mallampati: II  TM Distance: >3 FB Neck ROM: full    Dental no notable dental hx.    Pulmonary    Pulmonary exam normal       Cardiovascular hypertension, Rhythm:regular Rate:Normal     Neuro/Psych PSYCHIATRIC DISORDERS    GI/Hepatic   Endo/Other    Renal/GU      Musculoskeletal   Abdominal   Peds  Hematology   Anesthesia Other Findings   Reproductive/Obstetrics                             Anesthesia Physical Anesthesia Plan  ASA: II  Anesthesia Plan: MAC   Post-op Pain Management:    Induction:   Airway Management Planned:   Additional Equipment:   Intra-op Plan:   Post-operative Plan:   Informed Consent: I have reviewed the patients History and Physical, chart, labs and discussed the procedure including the risks, benefits and alternatives for the proposed anesthesia with the patient or authorized representative who has indicated his/her understanding and acceptance.     Plan Discussed with: CRNA  Anesthesia Plan Comments:         Anesthesia Quick Evaluation  

## 2015-03-23 NOTE — Anesthesia Procedure Notes (Signed)
Procedure Name: MAC Performed by: Cregg Jutte Pre-anesthesia Checklist: Patient identified, Emergency Drugs available, Suction available, Patient being monitored and Timeout performed Patient Re-evaluated:Patient Re-evaluated prior to inductionOxygen Delivery Method: Nasal cannula Preoxygenation: Pre-oxygenation with 100% oxygen     

## 2015-03-24 ENCOUNTER — Encounter: Payer: Self-pay | Admitting: Gastroenterology

## 2015-03-25 ENCOUNTER — Encounter: Payer: Self-pay | Admitting: Gastroenterology

## 2015-07-24 ENCOUNTER — Other Ambulatory Visit: Payer: Self-pay | Admitting: Family Medicine

## 2015-08-24 ENCOUNTER — Telehealth: Payer: Self-pay

## 2015-08-24 NOTE — Telephone Encounter (Signed)
Lmtcb, received nurse advice fax where patient called earlier when phones were not working complaining of hip pain, she was triaged. Just want to check on patient and see how she is doing, if appointment needs to be [place]

## 2015-12-14 ENCOUNTER — Other Ambulatory Visit: Payer: Self-pay | Admitting: Obstetrics and Gynecology

## 2015-12-14 ENCOUNTER — Ambulatory Visit
Admission: RE | Admit: 2015-12-14 | Discharge: 2015-12-14 | Disposition: A | Discharge status: Home / Self Care | Payer: BLUE CROSS/BLUE SHIELD | Source: Ambulatory Visit | Attending: Obstetrics and Gynecology | Admitting: Obstetrics and Gynecology

## 2015-12-14 DIAGNOSIS — Z1231 Encounter for screening mammogram for malignant neoplasm of breast: Secondary | ICD-10-CM | POA: Insufficient documentation

## 2016-01-12 ENCOUNTER — Ambulatory Visit (INDEPENDENT_AMBULATORY_CARE_PROVIDER_SITE_OTHER): Payer: BLUE CROSS/BLUE SHIELD | Admitting: Family Medicine

## 2016-01-12 VITALS — BP 182/104 | HR 112 | Temp 97.9°F | Resp 18 | Wt 159.0 lb

## 2016-01-12 DIAGNOSIS — G243 Spasmodic torticollis: Secondary | ICD-10-CM

## 2016-01-12 DIAGNOSIS — N951 Menopausal and female climacteric states: Secondary | ICD-10-CM | POA: Diagnosis not present

## 2016-01-12 DIAGNOSIS — D508 Other iron deficiency anemias: Secondary | ICD-10-CM

## 2016-01-12 DIAGNOSIS — F419 Anxiety disorder, unspecified: Secondary | ICD-10-CM | POA: Diagnosis not present

## 2016-01-12 DIAGNOSIS — R5383 Other fatigue: Secondary | ICD-10-CM

## 2016-01-12 DIAGNOSIS — I10 Essential (primary) hypertension: Secondary | ICD-10-CM | POA: Diagnosis not present

## 2016-01-12 DIAGNOSIS — K582 Mixed irritable bowel syndrome: Secondary | ICD-10-CM

## 2016-01-12 MED ORDER — METOPROLOL SUCCINATE ER 50 MG PO TB24: 50.0000 mg | ORAL_TABLET | Freq: Every day | ORAL | 12 refills | Status: DC

## 2016-01-12 MED ORDER — MIRTAZAPINE 30 MG PO TABS: 30.0000 mg | ORAL_TABLET | Freq: Every day | ORAL | 12 refills | Status: DC

## 2016-01-12 MED ORDER — ALPRAZOLAM 0.25 MG PO TABS: 0.2500 mg | ORAL_TABLET | Freq: Two times a day (BID) | ORAL | 5 refills | Status: DC | PRN

## 2016-01-12 NOTE — Progress Notes (Signed)
Catherine Herrera  MRN: [id number] DOB: 29-Dec-1965  Subjective:  HPI  Patient is here for follow up. Last visit was on 01/26/15. At that time for b/p Metoprolol was started. Also checked labs at that time-iron level was very low and patient was advised to take Iron tablet daily. She is still taking Toprol. No side effects from this as far as she knows. Readings the past few days have been 137/93, 143/94 and this morning 122/86. BP Readings from Last 3 Encounters:  01/12/16 (!) 182/104  03/23/15 102/71  03/04/15 (!) 168/97   She did take iron tablet daily until on re check levels were normal so she finished off the bottle and stopped. She was taking Paxil but noticed she was having trouble sleeping through the night again and felt nauseas in the morning so she stopped taking this a few months back. She takes Xanax usually 1 tablet daily but sometimes will take 2 tablets. Anxiety is still seems to be out of control. She also mentions a few other symptoms that have been going for a while over year at least for most of them and wonders if some of this is related to her been close to menopause stage and/or TSH issue. Symptoms are fatigue, nail abnormality on the left thumb, feeling bloated a lot, irregular period, hot flashes, diarrhea and constipation, having hard time staying asleep some nights. She is taking Flexeril daily. Her neck is still giving her trouble with feeling stiff and painful, she states she cares her stress/tension in the neck area. She states she has noticed some head shakiness/tremor.  Depression screen [facility] 2/9 01/12/2016  Decreased Interest 0  Down, Depressed, Hopeless 0  PHQ - 2 Score 0  Altered sleeping 1  Tired, decreased energy 2  Change in appetite 0  Feeling bad or failure about yourself  0  Trouble concentrating 0  Moving slowly or fidgety/restless 0  Suicidal thoughts 0  PHQ-9 Score 3   Patient Active Problem List   Diagnosis Date Noted  . Special  screening for malignant neoplasms, colon   . Benign neoplasm of sigmoid colon   . Absolute anemia 01/05/2015  . Anxiety 01/05/2015  . Allergic rhinitis 01/05/2015  . Clinical depression 01/05/2015  . Spasmodic torticollis 06/26/2006    Past Medical History:  Diagnosis Date  . Anemia   . Anxiety   . Hypertension     Social History   Social History  . Marital status: Married    Spouse name: N/A  . Number of children: N/A  . Years of education: N/A   Occupational History  . Not on file.   Social History Main Topics  . Smoking status: Never Smoker  . Smokeless tobacco: Never Used  . Alcohol use Yes  . Drug use: No  . Sexual activity: Not on file   Other Topics Concern  . Not on file   Social History Narrative  . No narrative on file    Outpatient Encounter Prescriptions as of 01/12/2016  Medication Sig Note  . ALPRAZolam (XANAX) 0.25 MG tablet Take 1 tablet (0.25 mg total) by mouth 2 (two) times daily as needed for anxiety.   . cyclobenzaprine (FLEXERIL) 10 MG tablet TAKE ONE TABLET BY MOUTH AT BEDTIME   . metoprolol succinate (TOPROL-XL) 25 MG 24 hr tablet Take 1 tablet (25 mg total) by mouth daily.   Marland Kitchen PARoxetine (PAXIL) 20 MG tablet Take 1 tablet (20 mg total) by mouth daily.   Marland Kitchen FLUARIX QUADRIVALENT  0.5 ML injection TO BE ADMINISTERED BY PHARMACIST FOR IMMUNIZATION 01/12/2016: Received from: External Pharmacy  . [DISCONTINUED] Fe Fum-FePoly-FA-Vit C-Vit B3 (INTEGRA F) 125-1 MG CAPS Take by mouth.    No facility-administered encounter medications on file as of 01/12/2016.     No Known Allergies  Review of Systems  Constitutional: Positive for malaise/fatigue.       Hot flashes.  Respiratory: Negative.   Cardiovascular: Positive for palpitations (at times).  Gastrointestinal: Positive for constipation and diarrhea.       Bloated  Musculoskeletal: Positive for joint pain, myalgias and neck pain.  Skin:       Nail abnormality.  Neurological: Positive for  tremors.  Psychiatric/Behavioral: The patient is nervous/anxious and has insomnia.     Objective:  BP (!) 182/104   Pulse (!) 112   Temp 97.9 F (36.6 C)   Resp 18   Wt 159 lb (72.1 kg)   LMP 12/12/2015 Comment: perimenopausal  BMI 25.66 kg/m   Physical Exam  Constitutional: She is oriented to person, place, and time and well-developed, well-nourished, and in no distress.  HENT:  Head: Normocephalic and atraumatic.  Right Ear: External ear normal.  Left Ear: External ear normal.  Nose: Nose normal.  Mouth/Throat: Oropharynx is clear and moist.  Neck: Normal range of motion. Neck supple.  Mild torticullis  Cardiovascular: Normal rate, regular rhythm, normal heart sounds and intact distal pulses.   No murmur heard. Pulmonary/Chest: Effort normal and breath sounds normal. No respiratory distress. She has no wheezes.  Abdominal: Soft. She exhibits no distension. There is no tenderness.  Musculoskeletal: She exhibits no edema or tenderness.  Neurological: She is alert and oriented to person, place, and time. No cranial nerve deficit. She exhibits normal muscle tone. Gait normal. Coordination normal. GCS score is 15.  Very mild head tremor noted. Some mild torticollis also noted.  Skin: Skin is warm and dry.  Psychiatric: Mood, memory, affect and judgment normal.    Assessment and Plan :  1. Anxiety Not controlled. Try Mirtazapine which hopefully will help anxiety and insomnia. - ALPRAZolam (XANAX) 0.25 MG tablet; Take 1 tablet (0.25 mg total) by mouth 2 (two) times daily as needed for anxiety.  Dispense: 60 tablet; Refill: 5  2. Essential hypertension Increase Metoprolol to 50 mg. - CBC w/Diff/Platelet - Comprehensive metabolic panel - metoprolol succinate (TOPROL-XL) 50 MG 24 hr tablet; Take 1 tablet (50 mg total) by mouth daily.  Dispense: 30 tablet; Refill: 12  3. Spasmodic torticollis Discussed tremor in her neck and that we may need refer her to neurologist for this.    4. Other iron deficiency anemia Re check labs. Advised patient to provide copy of lipid results to [place] on the next visit. - CBC w/Diff/Platelet - Iron  5. Irritable bowel syndrome with both constipation and diarrhea Discussed with patient trying to cut back from dairy products and will re address on the next visit.  6. Other fatigue - TSH - Vitamin D (25 hydroxy)  7. Menopausal symptoms Check levels. - LH - FSH 8. Head tremor Consider neurology referral. HPI, Exam and A&P transcribed under direction and in the presence of Miguel Aschoff, MD. I have done the exam and reviewed the chart and it is accurate to the best of my knowledge. Development worker, community has been used and  any errors in dictation or transcription are unintentional. Miguel Aschoff M.D. [facility]

## 2016-01-13 ENCOUNTER — Telehealth: Payer: Self-pay

## 2016-01-13 DIAGNOSIS — N289 Disorder of kidney and ureter, unspecified: Secondary | ICD-10-CM

## 2016-01-13 LAB — COMPREHENSIVE METABOLIC PANEL
A/G RATIO: 1.1 — AB (ref 1.2–2.2)
ALK PHOS: 84 IU/L (ref 39–117)
ALT: 14 IU/L (ref 0–32)
AST: 21 IU/L (ref 0–40)
Albumin: 4.5 g/dL (ref 3.5–5.5)
BUN/Creatinine Ratio: 9 (ref 9–23)
BUN: 11 mg/dL (ref 6–24)
CALCIUM: 9.6 mg/dL (ref 8.7–10.2)
CHLORIDE: 100 mmol/L (ref 96–106)
CO2: 23 mmol/L (ref 18–29)
Creatinine, Ser: 1.17 mg/dL — ABNORMAL HIGH (ref 0.57–1.00)
GFR calc Af Amer: 62 mL/min/{1.73_m2} (ref >59)
GFR calc non Af Amer: 54 mL/min/{1.73_m2} — ABNORMAL LOW (ref >59)
Globulin, Total: 4 g/dL (ref 1.5–4.5)
Glucose: 81 mg/dL (ref 65–99)
POTASSIUM: 4 mmol/L (ref 3.5–5.2)
SODIUM: 140 mmol/L (ref 134–144)
Total Protein: 8.5 g/dL (ref 6.0–8.5)

## 2016-01-13 LAB — VITAMIN D 25 HYDROXY (VIT D DEFICIENCY, FRACTURES): Vit D, 25-Hydroxy: 31.8 ng/mL (ref 30.0–100.0)

## 2016-01-13 LAB — CBC WITH DIFFERENTIAL/PLATELET
BASOS: 0 %
Basophils Absolute: 0 10*3/uL (ref 0.0–0.2)
EOS (ABSOLUTE): 0.1 10*3/uL (ref 0.0–0.4)
Eos: 1 %
Hematocrit: 37.2 % (ref 34.0–46.6)
Hemoglobin: 12.4 g/dL (ref 11.1–15.9)
IMMATURE GRANULOCYTES: 0 %
Immature Grans (Abs): 0 10*3/uL (ref 0.0–0.1)
LYMPHS ABS: 1.2 10*3/uL (ref 0.7–3.1)
Lymphs: 20 %
MCH: 30.3 pg (ref 26.6–33.0)
MCHC: 33.3 g/dL (ref 31.5–35.7)
MCV: 91 fL (ref 79–97)
MONOS ABS: 0.5 10*3/uL (ref 0.1–0.9)
Monocytes: 8 %
NEUTROS PCT: 71 %
Neutrophils Absolute: 4.3 10*3/uL (ref 1.4–7.0)
PLATELETS: 249 10*3/uL (ref 150–379)
RBC: 4.09 x10E6/uL (ref 3.77–5.28)
RDW: 13.5 % (ref 12.3–15.4)
WBC: 6.1 10*3/uL (ref 3.4–10.8)

## 2016-01-13 LAB — FOLLICLE STIMULATING HORMONE: FSH: 85.5 m[IU]/mL

## 2016-01-13 LAB — IRON: IRON: 75 ug/dL (ref 27–159)

## 2016-01-13 LAB — LUTEINIZING HORMONE: LH: 53.9 m[IU]/mL

## 2016-01-13 LAB — TSH: TSH: 3.14 u[IU]/mL (ref 0.450–4.500)

## 2016-01-13 NOTE — Telephone Encounter (Signed)
LMTCB-KW 

## 2016-01-13 NOTE — Telephone Encounter (Signed)
-----   Message from Jerrol Banana., MD sent at 01/13/2016  8:03 AM EST ----- Labs stable. Mild decrease in kidney function. Would refer to nephrology just to make sure everything is okay. Anemia is improved from a year ago.

## 2016-01-14 NOTE — Telephone Encounter (Signed)
Patient has been advised and referral has been placed. KW

## 2016-01-18 ENCOUNTER — Ambulatory Visit: Payer: BLUE CROSS/BLUE SHIELD | Admitting: Family Medicine

## 2016-03-14 ENCOUNTER — Encounter: Payer: Self-pay | Admitting: Family Medicine

## 2016-03-14 ENCOUNTER — Ambulatory Visit (INDEPENDENT_AMBULATORY_CARE_PROVIDER_SITE_OTHER): Payer: BLUE CROSS/BLUE SHIELD | Admitting: Family Medicine

## 2016-03-14 VITALS — BP 152/92 | HR 92 | Temp 98.2°F | Resp 16 | Wt 164.0 lb

## 2016-03-14 DIAGNOSIS — K582 Mixed irritable bowel syndrome: Secondary | ICD-10-CM

## 2016-03-14 DIAGNOSIS — I10 Essential (primary) hypertension: Secondary | ICD-10-CM | POA: Diagnosis not present

## 2016-03-14 DIAGNOSIS — F419 Anxiety disorder, unspecified: Secondary | ICD-10-CM | POA: Diagnosis not present

## 2016-03-14 MED ORDER — SERTRALINE HCL 50 MG PO TABS: 50.0000 mg | ORAL_TABLET | Freq: Every day | ORAL | 3 refills | Status: DC

## 2016-03-14 NOTE — Progress Notes (Signed)
Subjective:  HPI  Hypertension, follow-up:  BP Readings from Last 3 Encounters:  03/14/16 (!) 152/92  01/12/16 (!) 182/104  03/23/15 102/71    She was last seen for hypertension 2 months ago.  BP at that visit was 182/104. Management since that visit includes Increase Metoprolol to 50 mg. She reports good compliance with treatment. She is not having side effects.  She is exercising daily. She is adherent to low salt diet.   Outside blood pressures are running 120-130's/70-80's. She is experiencing palpitations. But this is not new and pt thinks its is due to anxiety.  Patient denies chest pain, chest pressure/discomfort, claudication, dyspnea, exertional chest pressure/discomfort, fatigue, lower extremity edema, near-syncope, orthopnea, paroxysmal nocturnal dyspnea, syncope and tachypnea.     Wt Readings from Last 3 Encounters:  03/14/16 164 lb (74.4 kg)  01/12/16 159 lb (72.1 kg)  03/23/15 147 lb (66.7 kg)   ------------------------------------------------------------------------  Anxiety- 2 month follow up. Pt started on Mirtazapine LOV. Pt reports that she held off starting mirtazapine until after the holidays incase she had any side effects and she has been taking it for about 3 weeks now but she has not noticed a difference in her sleep or her anxiety.   IBS- Pt was to decrease dairy product intake and reassess next OV. Pt reports that she has not had any further problems with her stomach since last OV.   Prior to Admission medications   Medication Sig Start Date End Date Taking? Authorizing Provider  ALPRAZolam (XANAX) 0.25 MG tablet Take 1 tablet (0.25 mg total) by mouth 2 (two) times daily as needed for anxiety. 01/12/16  Yes Asani Mcburney Maceo Pro., MD  cyclobenzaprine (FLEXERIL) 10 MG tablet TAKE ONE TABLET BY MOUTH AT BEDTIME 07/27/15  Yes Ivie Savitt Maceo Pro., MD  metoprolol succinate (TOPROL-XL) 50 MG 24 hr tablet Take 1 tablet (50 mg total) by mouth daily.  01/12/16  Yes Niyanna Asch Maceo Pro., MD  mirtazapine (REMERON) 30 MG tablet Take 1 tablet (30 mg total) by mouth at bedtime. 01/12/16  Yes Sharrell Krawiec Maceo Pro., MD    Patient Active Problem List   Diagnosis Date Noted  . Special screening for malignant neoplasms, colon   . Benign neoplasm of sigmoid colon   . Absolute anemia 01/05/2015  . Anxiety 01/05/2015  . Allergic rhinitis 01/05/2015  . Clinical depression 01/05/2015  . Spasmodic torticollis 06/26/2006    Past Medical History:  Diagnosis Date  . Anemia   . Anxiety   . Hypertension     Social History   Social History  . Marital status: Married    Spouse name: N/A  . Number of children: N/A  . Years of education: N/A   Occupational History  . Not on file.   Social History Main Topics  . Smoking status: Never Smoker  . Smokeless tobacco: Never Used  . Alcohol use Yes  . Drug use: No  . Sexual activity: Not on file   Other Topics Concern  . Not on file   Social History Narrative  . No narrative on file    No Known Allergies  Review of Systems  Constitutional: Negative.   HENT: Negative.   Eyes: Negative.   Respiratory: Negative.   Cardiovascular: Positive for palpitations.  Gastrointestinal: Negative.   Genitourinary: Negative.   Musculoskeletal: Negative.   Skin: Negative.   Neurological: Negative.   Endo/Heme/Allergies: Negative.   Psychiatric/Behavioral: The patient is nervous/anxious.     Immunization History  Administered Date(s) Administered  . Influenza Whole 12/04/2015  . Influenza,inj,Quad PF,36+ Mos 11/20/2014  . Tdap 01/26/2012    Objective:  BP (!) 152/92 (BP Location: Left Arm, Patient Position: Sitting, Cuff Size: Normal)   Pulse 92   Temp 98.2 F (36.8 C) (Oral)   Resp 16   Wt 164 lb (74.4 kg)   BMI 26.47 kg/m   Physical Exam  Constitutional: She is oriented to person, place, and time and well-developed, well-nourished, and in no distress.  HENT:  Head: Normocephalic  and atraumatic.  Right Ear: External ear normal.  Left Ear: External ear normal.  Nose: Nose normal.  Eyes: Conjunctivae and EOM are normal. Pupils are equal, round, and reactive to light.  Neck: Normal range of motion. Neck supple.  Cardiovascular: Normal rate, regular rhythm, normal heart sounds and intact distal pulses.   Pulmonary/Chest: Effort normal and breath sounds normal.  Abdominal: Soft.  Musculoskeletal: Normal range of motion.  Neurological: She is alert and oriented to person, place, and time. She has normal reflexes. Gait normal. GCS score is 15.  Skin: Skin is warm and dry.  Psychiatric: Mood, memory, affect and judgment normal.    Lab Results  Component Value Date   WBC 6.1 01/12/2016   HGB 12.2 08/08/2012   HCT 37.2 01/12/2016   PLT 249 01/12/2016   GLUCOSE 81 01/12/2016   CHOL 222 (A) 08/08/2012   TRIG 89 08/08/2012   HDL 74 (A) 08/08/2012   LDLCALC 130 08/08/2012   TSH 3.140 01/12/2016    CMP     Component Value Date/Time   NA 140 01/12/2016 1431   K 4.0 01/12/2016 1431   CL 100 01/12/2016 1431   CO2 23 01/12/2016 1431   GLUCOSE 81 01/12/2016 1431   BUN 11 01/12/2016 1431   CREATININE 1.17 (H) 01/12/2016 1431   CALCIUM 9.6 01/12/2016 1431   PROT 8.5 01/12/2016 1431   ALBUMIN 4.5 01/12/2016 1431   AST 21 01/12/2016 1431   ALT 14 01/12/2016 1431   ALKPHOS 84 01/12/2016 1431   BILITOT <0.2 01/12/2016 1431   GFRNONAA 54 (L) 01/12/2016 1431   GFRAA 62 01/12/2016 1431    Assessment and Plan :  1. Anxiety Pt has tried Paxil and Citalopram for this is in the past years. Today, Stop Mirtazapine and start Zoloft. Follow up in 1 month.  - sertraline (ZOLOFT) 50 MG tablet; Take 1 tablet (50 mg total) by mouth daily.  Dispense: 30 tablet; Refill: 3  2. Essential hypertension Improved. BP at home much better than in office today.  Home BP all below 134/86. 3. Irritable bowel syndrome with both constipation and diarrhea Improved.  4.  Torticollis Stable.   HPI, Exam, and A&P Transcribed under the direction and in the presence of Canary Fister L. Cranford Mon, MD  Electronically Signed: Katina Dung, CMA I have done the exam and reviewed the above chart and it is accurate to the best of my knowledge. Development worker, community has been used in this note in any air is in the dictation or transcription are unintentional.  [facility] 03/14/2016 9:47 AM

## 2016-04-11 ENCOUNTER — Ambulatory Visit: Payer: BLUE CROSS/BLUE SHIELD | Admitting: Family Medicine

## 2016-05-12 ENCOUNTER — Other Ambulatory Visit: Payer: Self-pay | Admitting: Physician Assistant

## 2016-05-12 DIAGNOSIS — Z20828 Contact with and (suspected) exposure to other viral communicable diseases: Secondary | ICD-10-CM

## 2016-05-12 MED ORDER — OSELTAMIVIR PHOSPHATE 75 MG PO CAPS: 75.0000 mg | ORAL_CAPSULE | Freq: Every day | ORAL | 0 refills | Status: AC

## 2016-05-12 NOTE — Progress Notes (Signed)
Husband with flu in office, sent in prophylaxis.

## 2016-08-01 ENCOUNTER — Other Ambulatory Visit: Payer: Self-pay | Admitting: Family Medicine

## 2016-08-11 ENCOUNTER — Other Ambulatory Visit: Payer: Self-pay | Admitting: Family Medicine

## 2016-08-11 DIAGNOSIS — F419 Anxiety disorder, unspecified: Secondary | ICD-10-CM

## 2016-08-12 ENCOUNTER — Other Ambulatory Visit: Payer: Self-pay | Admitting: Family Medicine

## 2016-08-12 DIAGNOSIS — F419 Anxiety disorder, unspecified: Secondary | ICD-10-CM

## 2016-10-19 ENCOUNTER — Encounter: Payer: Self-pay | Admitting: Family Medicine

## 2016-12-12 ENCOUNTER — Other Ambulatory Visit: Payer: Self-pay | Admitting: Obstetrics and Gynecology

## 2016-12-12 DIAGNOSIS — Z1231 Encounter for screening mammogram for malignant neoplasm of breast: Secondary | ICD-10-CM

## 2016-12-15 ENCOUNTER — Ambulatory Visit
Admission: RE | Admit: 2016-12-15 | Discharge: 2016-12-15 | Disposition: A | Discharge status: Home / Self Care | Payer: BLUE CROSS/BLUE SHIELD | Source: Ambulatory Visit | Attending: Obstetrics and Gynecology | Admitting: Obstetrics and Gynecology

## 2016-12-15 DIAGNOSIS — Z1231 Encounter for screening mammogram for malignant neoplasm of breast: Secondary | ICD-10-CM | POA: Diagnosis present

## 2016-12-16 ENCOUNTER — Encounter: Payer: Self-pay | Admitting: Obstetrics and Gynecology

## 2017-01-12 ENCOUNTER — Other Ambulatory Visit: Payer: Self-pay | Admitting: Family Medicine

## 2017-01-12 DIAGNOSIS — I10 Essential (primary) hypertension: Secondary | ICD-10-CM

## 2017-02-12 ENCOUNTER — Other Ambulatory Visit: Payer: Self-pay | Admitting: Family Medicine

## 2017-02-12 DIAGNOSIS — I10 Essential (primary) hypertension: Secondary | ICD-10-CM

## 2017-02-12 DIAGNOSIS — F419 Anxiety disorder, unspecified: Secondary | ICD-10-CM

## 2017-04-15 ENCOUNTER — Other Ambulatory Visit: Payer: Self-pay | Admitting: Family Medicine

## 2017-04-15 DIAGNOSIS — I10 Essential (primary) hypertension: Secondary | ICD-10-CM

## 2017-04-17 ENCOUNTER — Other Ambulatory Visit: Payer: Self-pay

## 2017-04-17 DIAGNOSIS — I10 Essential (primary) hypertension: Secondary | ICD-10-CM

## 2017-04-17 MED ORDER — METOPROLOL SUCCINATE ER 50 MG PO TB24: 50.0000 mg | ORAL_TABLET | Freq: Every day | ORAL | 0 refills | Status: DC

## 2017-05-16 ENCOUNTER — Other Ambulatory Visit: Payer: Self-pay | Admitting: Family Medicine

## 2017-05-16 DIAGNOSIS — I10 Essential (primary) hypertension: Secondary | ICD-10-CM

## 2017-06-15 ENCOUNTER — Other Ambulatory Visit: Payer: Self-pay | Admitting: Family Medicine

## 2017-06-15 DIAGNOSIS — I10 Essential (primary) hypertension: Secondary | ICD-10-CM

## 2017-08-28 ENCOUNTER — Ambulatory Visit (INDEPENDENT_AMBULATORY_CARE_PROVIDER_SITE_OTHER): Payer: BLUE CROSS/BLUE SHIELD | Admitting: Family Medicine

## 2017-08-28 VITALS — BP 152/88 | HR 88 | Temp 97.5°F | Resp 16 | Wt 162.0 lb

## 2017-08-28 DIAGNOSIS — L570 Actinic keratosis: Secondary | ICD-10-CM | POA: Diagnosis not present

## 2017-08-28 DIAGNOSIS — I10 Essential (primary) hypertension: Secondary | ICD-10-CM

## 2017-08-28 DIAGNOSIS — M436 Torticollis: Secondary | ICD-10-CM | POA: Diagnosis not present

## 2017-08-28 DIAGNOSIS — F419 Anxiety disorder, unspecified: Secondary | ICD-10-CM

## 2017-08-28 NOTE — Progress Notes (Signed)
Catherine Herrera  MRN: [id number] DOB: 06-14-1965  Subjective:  HPI   The patient is a 52 year old female who presents for follow up of chronic health.  She was last seen on 03/14/17.    Hypertension-The patient states that she has higher readings wheenever she is in the office.  She has brought with her a list of readings she got at home.  The range  From 657-846 systolic and 96-29 diastolic.  BP Readings from Last 3 Encounters:  08/28/17 (!) 152/88  03/14/16 (!) 152/92  01/12/16 (!) 182/104   Anxiety-the patient was instructed on her last visit to discontinue Mirtazapine and start Sertraline.  He was asked to f/u in 1 month.  The patient states she has not been able to take the antidepressant medications.  She said it ineterfered with her sleep causing it to be difficult to get up in the morning for work.  She states that she just wants to stick with the medications she is currently on for her anxiety and not try any other anti-depressants.    Patient Active Problem List   Diagnosis Date Noted  . Special screening for malignant neoplasms, colon   . Benign neoplasm of sigmoid colon   . Absolute anemia 01/05/2015  . Anxiety 01/05/2015  . Allergic rhinitis 01/05/2015  . Clinical depression 01/05/2015  . Spasmodic torticollis 06/26/2006    Past Medical History:  Diagnosis Date  . Anemia   . Anxiety   . Hypertension     Social History   Socioeconomic History  . Marital status: Married    Spouse name: Not on file  . Number of children: Not on file  . Years of education: Not on file  . Highest education level: Not on file  Occupational History  . Not on file  Social Needs  . Financial resource strain: Not on file  . Food insecurity:    Worry: Not on file    Inability: Not on file  . Transportation needs:    Medical: Not on file    Non-medical: Not on file  Tobacco Use  . Smoking status: Never Smoker  . Smokeless tobacco: Never Used  Substance and Sexual  Activity  . Alcohol use: Yes  . Drug use: No  . Sexual activity: Not on file  Lifestyle  . Physical activity:    Days per week: Not on file    Minutes per session: Not on file  . Stress: Not on file  Relationships  . Social connections:    Talks on phone: Not on file    Gets together: Not on file    Attends religious service: Not on file    Active member of club or organization: Not on file    Attends meetings of clubs or organizations: Not on file    Relationship status: Not on file  . Intimate partner violence:    Fear of current or ex partner: Not on file    Emotionally abused: Not on file    Physically abused: Not on file    Forced sexual activity: Not on file  Other Topics Concern  . Not on file  Social History Narrative  . Not on file    Outpatient Encounter Medications as of 08/28/2017  Medication Sig  . ALPRAZolam (XANAX) 0.25 MG tablet TAKE 1 TABLET BY MOUTH TWICE DAILY AS NEEDED FOR ANXIETY  . cyclobenzaprine (FLEXERIL) 10 MG tablet TAKE ONE TABLET BY MOUTH AT BEDTIME  . metoprolol succinate (TOPROL-XL) 50  MG 24 hr tablet TAKE 1 TABLET BY MOUTH ONCE DAILY. TAKE WITH OR IMMEDIATELY FOLLOWING A MEAL  . sertraline (ZOLOFT) 50 MG tablet Take 1 tablet (50 mg total) by mouth daily. (Patient not taking: Reported on 08/28/2017)   No facility-administered encounter medications on file as of 08/28/2017.     No Known Allergies  Review of Systems  Constitutional: Positive for malaise/fatigue. Negative for fever.  Eyes: Negative.   Respiratory: Negative for shortness of breath and wheezing.   Cardiovascular: Positive for palpitations. Negative for chest pain, orthopnea and leg swelling.  Gastrointestinal: Negative.   Musculoskeletal: Positive for neck pain.  Neurological: Negative for dizziness and headaches.  Endo/Heme/Allergies: Negative.   Psychiatric/Behavioral: Negative for depression, hallucinations, memory loss, substance abuse and suicidal ideas. The patient is  nervous/anxious. The patient does not have insomnia.     Objective:  BP (!) 152/88 (BP Location: Right Arm, Patient Position: Sitting, Cuff Size: Normal)   Pulse 88   Temp (!) 97.5 F (36.4 C) (Oral)   Resp 16   Wt 162 lb (73.5 kg)   BMI 26.15 kg/m   Physical Exam  Constitutional: She is oriented to person, place, and time and well-developed, well-nourished, and in no distress.  HENT:  Head: Normocephalic and atraumatic.  Right Ear: External ear normal.  Left Ear: External ear normal.  Nose: Nose normal.  Eyes: Conjunctivae are normal. No scleral icterus.  Neck: No thyromegaly present.  Cardiovascular: Normal rate, regular rhythm and normal heart sounds.  Pulmonary/Chest: Effort normal and breath sounds normal.  Abdominal: Soft.  Musculoskeletal: She exhibits no edema.  Neurological: She is alert and oriented to person, place, and time. Gait normal. GCS score is 15.  Skin: Skin is warm and dry.  Psychiatric: Mood, memory, affect and judgment normal.    Assessment and Plan :  1. Anxiety Discussed trying to limit use of Xanax. - ALPRAZolam (XANAX) 0.25 MG tablet; Take 1 tablet (0.25 mg total) by mouth 2 (two) times daily as needed. for anxiety  Dispense: 180 tablet; Refill: 1  2. Essential hypertension  - metoprolol succinate (TOPROL-XL) 50 MG 24 hr tablet; Take 1 tablet (50 mg total) by mouth daily. Take with or immediately following a meal.  Dispense: 90 tablet; Refill: 3  3. Actinic keratosis  - Ambulatory referral to Dermatology  4.Chronic Torticollis Per [facility].  I have done the exam and reviewed the chart and it is accurate to the best of my knowledge. Development worker, community has been used and  any errors in dictation or transcription are unintentional. Miguel Aschoff M.D. [facility]

## 2017-08-29 ENCOUNTER — Telehealth: Payer: Self-pay | Admitting: Family Medicine

## 2017-08-29 MED ORDER — METOPROLOL SUCCINATE ER 50 MG PO TB24: 50.0000 mg | ORAL_TABLET | Freq: Every day | ORAL | 3 refills | Status: DC

## 2017-08-29 MED ORDER — ALPRAZOLAM 0.25 MG PO TABS: 0.2500 mg | ORAL_TABLET | Freq: Two times a day (BID) | ORAL | 1 refills | Status: DC | PRN

## 2017-08-29 MED ORDER — CYCLOBENZAPRINE HCL 10 MG PO TABS: 10.0000 mg | ORAL_TABLET | Freq: Every day | ORAL | 3 refills | Status: DC

## 2017-08-29 NOTE — Telephone Encounter (Signed)
Could you sign off on these Thanks

## 2017-08-29 NOTE — Telephone Encounter (Signed)
Pt was in yesterday and she was supposed to get refills on her medications.  She uses [facility] in [place] but nothing has been sent in as of today.  Pt's CB # is [phone]  Thanks Con Memos

## 2017-12-27 ENCOUNTER — Ambulatory Visit: Payer: BLUE CROSS/BLUE SHIELD

## 2018-02-05 ENCOUNTER — Other Ambulatory Visit: Payer: Self-pay | Admitting: Obstetrics and Gynecology

## 2018-03-02 ENCOUNTER — Other Ambulatory Visit: Payer: Self-pay | Admitting: Family Medicine

## 2018-03-02 DIAGNOSIS — F419 Anxiety disorder, unspecified: Secondary | ICD-10-CM

## 2018-03-02 NOTE — Telephone Encounter (Signed)
Pharmacy requesting refills. Thanks!  

## 2018-03-05 ENCOUNTER — Ambulatory Visit: Payer: Self-pay | Admitting: Family Medicine

## 2018-03-12 ENCOUNTER — Ambulatory Visit: Payer: Self-pay | Admitting: Family Medicine

## 2018-04-05 ENCOUNTER — Other Ambulatory Visit: Payer: Self-pay | Admitting: Obstetrics and Gynecology

## 2018-04-05 DIAGNOSIS — Z1231 Encounter for screening mammogram for malignant neoplasm of breast: Secondary | ICD-10-CM

## 2018-04-11 ENCOUNTER — Encounter (INDEPENDENT_AMBULATORY_CARE_PROVIDER_SITE_OTHER): Payer: Self-pay

## 2018-04-11 ENCOUNTER — Ambulatory Visit
Admission: RE | Admit: 2018-04-11 | Discharge: 2018-04-11 | Disposition: A | Discharge status: Home / Self Care | Payer: BLUE CROSS/BLUE SHIELD | Source: Ambulatory Visit | Attending: Obstetrics and Gynecology | Admitting: Obstetrics and Gynecology

## 2018-04-11 DIAGNOSIS — Z1231 Encounter for screening mammogram for malignant neoplasm of breast: Secondary | ICD-10-CM | POA: Insufficient documentation

## 2018-04-30 ENCOUNTER — Other Ambulatory Visit: Payer: Self-pay | Admitting: Family Medicine

## 2018-04-30 DIAGNOSIS — F419 Anxiety disorder, unspecified: Secondary | ICD-10-CM

## 2018-06-02 ENCOUNTER — Other Ambulatory Visit: Payer: Self-pay | Admitting: Family Medicine

## 2018-06-02 DIAGNOSIS — F419 Anxiety disorder, unspecified: Secondary | ICD-10-CM

## 2018-07-02 ENCOUNTER — Other Ambulatory Visit: Payer: Self-pay | Admitting: Family Medicine

## 2018-07-02 DIAGNOSIS — F419 Anxiety disorder, unspecified: Secondary | ICD-10-CM

## 2018-07-02 NOTE — Telephone Encounter (Signed)
Please review

## 2018-07-02 NOTE — Telephone Encounter (Signed)
Needs PCP f/u. 

## 2018-07-02 NOTE — Telephone Encounter (Signed)
Will refill, but will need PCP f/u before further refills given

## 2018-07-03 ENCOUNTER — Telehealth: Payer: Self-pay

## 2018-07-03 NOTE — Telephone Encounter (Signed)
Left message for patient to call back and schedule an appointment.

## 2018-08-31 ENCOUNTER — Other Ambulatory Visit: Payer: Self-pay | Admitting: Family Medicine

## 2018-08-31 DIAGNOSIS — I10 Essential (primary) hypertension: Secondary | ICD-10-CM

## 2018-10-03 ENCOUNTER — Other Ambulatory Visit: Payer: Self-pay | Admitting: Family Medicine

## 2018-10-03 DIAGNOSIS — F419 Anxiety disorder, unspecified: Secondary | ICD-10-CM

## 2018-10-03 DIAGNOSIS — I10 Essential (primary) hypertension: Secondary | ICD-10-CM

## 2018-10-03 NOTE — Telephone Encounter (Signed)
Pt needs refills on   Alprazolam .025 mg  Flexeril 10 mg  Metoprolol 50 mg  [facility] [place]  Con Memos

## 2018-10-04 ENCOUNTER — Other Ambulatory Visit: Payer: Self-pay

## 2018-10-04 DIAGNOSIS — I10 Essential (primary) hypertension: Secondary | ICD-10-CM

## 2018-10-04 DIAGNOSIS — F419 Anxiety disorder, unspecified: Secondary | ICD-10-CM

## 2018-10-04 MED ORDER — ALPRAZOLAM 0.25 MG PO TABS: ORAL_TABLET | ORAL | 0 refills | Status: DC

## 2018-10-04 MED ORDER — METOPROLOL SUCCINATE ER 50 MG PO TB24: ORAL_TABLET | ORAL | 0 refills | Status: DC

## 2018-10-04 MED ORDER — CYCLOBENZAPRINE HCL 10 MG PO TABS: 10.0000 mg | ORAL_TABLET | Freq: Every day | ORAL | 0 refills | Status: DC

## 2018-10-04 NOTE — Telephone Encounter (Signed)
Patient is only requesting that the blood pressure medication be refilled until her appointment. She will wait for the additional refills when she comes in for her appointment.

## 2018-10-04 NOTE — Telephone Encounter (Signed)
Patient returned Stacy call from yesterday. She scheduled a telephone visit for 10/10/2018 at 8:20am. Patient says she needs a temporary supply of medication sent in because she is out of medication. Please advise on refills.

## 2018-10-04 NOTE — Telephone Encounter (Signed)
Needs appt or E visit

## 2018-10-04 NOTE — Telephone Encounter (Signed)
Left message for patient to call back and schedule an appointment.

## 2018-10-10 ENCOUNTER — Other Ambulatory Visit: Payer: Self-pay

## 2018-10-10 ENCOUNTER — Ambulatory Visit (INDEPENDENT_AMBULATORY_CARE_PROVIDER_SITE_OTHER): Payer: BC Managed Care – PPO | Admitting: Family Medicine

## 2018-10-10 DIAGNOSIS — F419 Anxiety disorder, unspecified: Secondary | ICD-10-CM | POA: Diagnosis not present

## 2018-10-10 DIAGNOSIS — G243 Spasmodic torticollis: Secondary | ICD-10-CM | POA: Diagnosis not present

## 2018-10-10 DIAGNOSIS — I10 Essential (primary) hypertension: Secondary | ICD-10-CM

## 2018-10-10 MED ORDER — CYCLOBENZAPRINE HCL 10 MG PO TABS: 10.0000 mg | ORAL_TABLET | Freq: Every day | ORAL | 0 refills | Status: DC

## 2018-10-10 MED ORDER — METOPROLOL SUCCINATE ER 50 MG PO TB24: ORAL_TABLET | ORAL | 0 refills | Status: DC

## 2018-10-10 MED ORDER — ALPRAZOLAM 0.25 MG PO TABS: ORAL_TABLET | ORAL | 5 refills | Status: DC

## 2018-10-10 NOTE — Progress Notes (Signed)
Established Patient Office Visit  Subjective:  Patient ID: Catherine Herrera, female    DOB: [birth year]  Age: 53 y.o. MRN: [id number]  CC: No chief complaint on file. Anxiety f/u.  HPI [name] presents for anxiety phone call. Doing fairly well during pandemic.Requests refills. Torticollis a little worse. BP has been good. Now seeing Dr Cherylann Banas for gyn care. Past Medical History:  Diagnosis Date  . Anemia   . Anxiety   . Hypertension     Past Surgical History:  Procedure Laterality Date  . CHOLECYSTECTOMY    . COLONOSCOPY WITH PROPOFOL N/A 03/23/2015   Procedure: COLONOSCOPY WITH PROPOFOL;  Surgeon: Lucilla Lame, MD;  Location: [facility];  Service: Endoscopy;  Laterality: N/A;  . POLYPECTOMY  03/23/2015   Procedure: POLYPECTOMY;  Surgeon: Lucilla Lame, MD;  Location: [facility];  Service: Endoscopy;;    Family History  Problem Relation Age of Onset  . Hypertension Mother   . Hypothyroidism Mother   . Hypertension Father   . Prostate cancer Father   . Hyperthyroidism Sister   . Gallbladder disease Sister   . Multiple sclerosis Maternal Grandmother   . Heart attack Maternal Grandfather   . Heart disease Maternal Grandfather   . Breast cancer Neg Hx     Social History   Socioeconomic History  . Marital status: Married    Spouse name: Not on file  . Number of children: Not on file  . Years of education: Not on file  . Highest education level: Not on file  Occupational History  . Not on file  Social Needs  . Financial resource strain: Not on file  . Food insecurity    Worry: Not on file    Inability: Not on file  . Transportation needs    Medical: Not on file    Non-medical: Not on file  Tobacco Use  . Smoking status: Never Smoker  . Smokeless tobacco: Never Used  Substance and Sexual Activity  . Alcohol use: Yes  . Drug use: No  . Sexual activity: Not on file  Lifestyle  . Physical activity    Days per week: Not on file   Minutes per session: Not on file  . Stress: Not on file  Relationships  . Social Herbalist on phone: Not on file    Gets together: Not on file    Attends religious service: Not on file    Active member of club or organization: Not on file    Attends meetings of clubs or organizations: Not on file    Relationship status: Not on file  . Intimate partner violence    Fear of current or ex partner: Not on file    Emotionally abused: Not on file    Physically abused: Not on file    Forced sexual activity: Not on file  Other Topics Concern  . Not on file  Social History Narrative  . Not on file    Outpatient Medications Prior to Visit  Medication Sig Dispense Refill  . ALPRAZolam (XANAX) 0.25 MG tablet TAKE 1 TABLET BY MOUTH TWICE DAILY AS NEEDED FOR ANXIETY . APPOINTMENT REQUIRED FOR FUTURE REFILLS 60 tablet 0  . cyclobenzaprine (FLEXERIL) 10 MG tablet Take 1 tablet (10 mg total) by mouth at bedtime. 30 tablet 0  . metoprolol succinate (TOPROL-XL) 50 MG 24 hr tablet TAKE 1 TABLET BY MOUTH ONCE DAILY .TAKE  WITH  OR  IMMEDIATELY  FOLLOWING  MEAL 30  tablet 0  . sertraline (ZOLOFT) 50 MG tablet Take 1 tablet (50 mg total) by mouth daily. (Patient not taking: Reported on 08/28/2017) 30 tablet 3   No facility-administered medications prior to visit.     No Known Allergies  ROS Review of Systems    Objective:    Physical Exam  There were no vitals taken for this visit. Wt Readings from Last 3 Encounters:  08/28/17 162 lb (73.5 kg)  03/14/16 164 lb (74.4 kg)  01/12/16 159 lb (72.1 kg)     Health Maintenance Due  Topic Date Due  . HIV Screening  05/11/1979  . PAP SMEAR-Modifier  12/10/2017  . INFLUENZA VACCINE  09/15/2018    There are no preventive care reminders to display for this patient.  Lab Results  Component Value Date   TSH 3.140 01/12/2016   Lab Results  Component Value Date   WBC 6.1 01/12/2016   HGB 12.4 01/12/2016   HCT 37.2 01/12/2016   MCV  91 01/12/2016   PLT 249 01/12/2016   Lab Results  Component Value Date   NA 140 01/12/2016   K 4.0 01/12/2016   CO2 23 01/12/2016   GLUCOSE 81 01/12/2016   BUN 11 01/12/2016   CREATININE 1.17 (H) 01/12/2016   BILITOT <0.2 01/12/2016   ALKPHOS 84 01/12/2016   AST 21 01/12/2016   ALT 14 01/12/2016   PROT 8.5 01/12/2016   ALBUMIN 4.5 01/12/2016   CALCIUM 9.6 01/12/2016   Lab Results  Component Value Date   CHOL 222 (A) 08/08/2012   Lab Results  Component Value Date   HDL 74 (A) 08/08/2012   Lab Results  Component Value Date   LDLCALC 130 08/08/2012   Lab Results  Component Value Date   TRIG 89 08/08/2012   No results found for: CHOLHDL No results found for: HGBA1C    Assessment & Plan:   Problem List Items Addressed This Visit      Nervous and Auditory   Spasmodic torticollis     Other   Anxiety - Primary    Refill meds for 6 months then plan to see in office and get labs. Labs done last year by Gyn. Phone call of 10 minutes. No orders of the defined types were placed in this encounter.   Follow-up: No follow-ups on file.   Patient location: home Provider location: [place] office  Persons involved in the visit: patient, provider   [name], MD

## 2018-12-05 ENCOUNTER — Other Ambulatory Visit: Payer: Self-pay | Admitting: Family Medicine

## 2018-12-05 DIAGNOSIS — F419 Anxiety disorder, unspecified: Secondary | ICD-10-CM

## 2018-12-05 DIAGNOSIS — I10 Essential (primary) hypertension: Secondary | ICD-10-CM

## 2018-12-05 NOTE — Telephone Encounter (Signed)
Pt needing more refills on:  metoprolol succinate (TOPROL-XL) 50 MG 24 hr tablet cyclobenzaprine (FLEXERIL) 10 MG tablet ALPRAZolam (XANAX) 0.25 MG tablet   Please fill at: [facility] [address], [place] - [place] [phone] (Phone) [phone] (Fax)   Thanks, [employer]

## 2018-12-06 ENCOUNTER — Other Ambulatory Visit: Payer: Self-pay | Admitting: Family Medicine

## 2018-12-06 DIAGNOSIS — I10 Essential (primary) hypertension: Secondary | ICD-10-CM

## 2018-12-06 MED ORDER — ALPRAZOLAM 0.25 MG PO TABS: ORAL_TABLET | ORAL | 5 refills | Status: DC

## 2018-12-06 MED ORDER — CYCLOBENZAPRINE HCL 10 MG PO TABS: 10.0000 mg | ORAL_TABLET | Freq: Every day | ORAL | 0 refills | Status: DC

## 2018-12-06 MED ORDER — METOPROLOL SUCCINATE ER 50 MG PO TB24: ORAL_TABLET | ORAL | 0 refills | Status: DC

## 2018-12-27 ENCOUNTER — Other Ambulatory Visit: Payer: Self-pay | Admitting: Family Medicine

## 2018-12-27 DIAGNOSIS — I10 Essential (primary) hypertension: Secondary | ICD-10-CM

## 2018-12-27 NOTE — Telephone Encounter (Signed)
Pt needing more refills on:  cyclobenzaprine (FLEXERIL) 10 MG tablet metoprolol succinate (TOPROL-XL) 50 MG 24 hr tablet  Pt uses:  [facility] [address] [place] - [place] [phone] (Phone) [phone] (Fax)   Thanks, [employer]

## 2018-12-28 MED ORDER — METOPROLOL SUCCINATE ER 50 MG PO TB24: ORAL_TABLET | ORAL | 0 refills | Status: DC

## 2018-12-28 MED ORDER — CYCLOBENZAPRINE HCL 10 MG PO TABS: 10.0000 mg | ORAL_TABLET | Freq: Every day | ORAL | 11 refills | Status: DC

## 2019-01-28 ENCOUNTER — Other Ambulatory Visit: Payer: Self-pay | Admitting: Family Medicine

## 2019-01-28 DIAGNOSIS — I10 Essential (primary) hypertension: Secondary | ICD-10-CM

## 2019-01-28 MED ORDER — METOPROLOL SUCCINATE ER 50 MG PO TB24: ORAL_TABLET | ORAL | 3 refills | Status: DC

## 2019-01-28 NOTE — Telephone Encounter (Signed)
Medication Refill - Medication: metoprolol succinate (TOPROL-XL) 50 MG 24 hr tablet  Pt would like to know if she can have multiple refills on the metoprolol.  Has the patient contacted their pharmacy? Yes.   (Agent: If no, request that the patient contact the pharmacy for the refill.) (Agent: If yes, when and what did the pharmacy advise?)  Preferred Pharmacy (with phone number or street name):  [facility], [place] - [place] Phone:  [phone]  Fax:  [phone]       Agent: Please be advised that RX refills may take up to 3 business days. We ask that you follow-up with your pharmacy.

## 2019-07-01 ENCOUNTER — Other Ambulatory Visit: Payer: Self-pay | Admitting: Family Medicine

## 2019-07-01 DIAGNOSIS — I10 Essential (primary) hypertension: Secondary | ICD-10-CM

## 2019-07-01 DIAGNOSIS — F419 Anxiety disorder, unspecified: Secondary | ICD-10-CM

## 2019-07-01 NOTE — Telephone Encounter (Signed)
Requested medication (s) are due for refill today: Metoprolol, yes   Requested medication (s) are on the active medication list: yes  Last refill:  05/30/19  Future visit scheduled: no  Notes to clinic:  no valid encounter within last 6 months  Requested medication (s) are due for refill today: Alprazolam, yes  Requested medication (s) are on the active medication list: yes  Last refill: 05/30/19  Future visit scheduled: no  Notes to clinic: not delegated; no valid encounter within last 6 months Requested Prescriptions  Pending Prescriptions Disp Refills   metoprolol succinate (TOPROL-XL) 50 MG 24 hr tablet [Pharmacy Med Name: Metoprolol Succinate ER 50 MG Oral Tablet Extended Release 24 Hour] 30 tablet 0    Sig: TAKE 1 TABLET BY MOUTH ONCE DAILY .TAKE  WITH  OR  IMMEDIATELY  FOLLOWING  MEAL      Cardiovascular:  Beta Blockers Failed - 07/01/2019 12:14 PM      Failed - Last BP in normal range    BP Readings from Last 1 Encounters:  08/28/17 (!) 152/88          Failed - Valid encounter within last 6 months    Recent Outpatient Visits           8 months ago Anxiety   [facility] Jerrol Banana., MD   1 year ago [place] Jerrol Banana., MD   3 years ago [place] Jerrol Banana., MD   3 years ago [place] Jerrol Banana., MD   4 years ago [place] Jerrol Banana., MD              Passed - Last Heart Rate in normal range    Pulse Readings from Last 1 Encounters:  08/28/17 88            ALPRAZolam (XANAX) 0.25 MG tablet [Pharmacy Med Name: ALPRAZolam 0.25 MG Oral Tablet] 60 tablet 0    Sig: TAKE 1 TABLET BY MOUTH TWICE DAILY AS NEEDED FOR ANXIETY. APPOINTMENT REQUIRED FOR FURTHER REFILLS      Not Delegated - Psychiatry:  Anxiolytics/Hypnotics Failed - 07/01/2019 12:14 PM      Failed - This  refill cannot be delegated      Failed - Urine Drug Screen completed in last 360 days.      Failed - Valid encounter within last 6 months    Recent Outpatient Visits           8 months ago Anxiety   [facility] Jerrol Banana., MD   1 year ago [place] Jerrol Banana., MD   3 years ago [place] Jerrol Banana., MD   3 years ago [place] Jerrol Banana., MD   4 years ago [place] Jerrol Banana., MD

## 2019-08-01 ENCOUNTER — Other Ambulatory Visit: Payer: Self-pay | Admitting: Family Medicine

## 2019-08-01 DIAGNOSIS — F419 Anxiety disorder, unspecified: Secondary | ICD-10-CM

## 2019-08-01 DIAGNOSIS — I10 Essential (primary) hypertension: Secondary | ICD-10-CM

## 2019-08-01 NOTE — Telephone Encounter (Signed)
Requested medication (s) are due for refill today: yes  Requested medication (s) are on the active medication list: yes  Last refill:  07/02/19 both refilled  Future visit scheduled: No overdue for appointment.  Notes to clinic:  Called and spoke with patient.  She states she will call back to schedule appointment.. She was unable to do it as we spoke. Xanax is not delegated. Sending metoprolol back for your judgment as to reorder since she was unable to schedule appointment. Last OV 10/10/18.    Requested Prescriptions  Pending Prescriptions Disp Refills   metoprolol succinate (TOPROL-XL) 50 MG 24 hr tablet [Pharmacy Med Name: Metoprolol Succinate ER 50 MG Oral Tablet Extended Release 24 Hour] 30 tablet 0    Sig: TAKE 1 TABLET BY MOUTH ONCE DAILY. TAKE WITH OR IMMEDIATELY FOLLOWING MEAL.      Cardiovascular:  Beta Blockers Failed - 08/01/2019  4:26 PM      Failed - Last BP in normal range    BP Readings from Last 1 Encounters:  08/28/17 (!) 152/88          Failed - Valid encounter within last 6 months    Recent Outpatient Visits           9 months ago [place] Jerrol Banana., MD   1 year ago [place] Jerrol Banana., MD   3 years ago [place] Jerrol Banana., MD   3 years ago [place] Jerrol Banana., MD   4 years ago [place] Jerrol Banana., MD              Passed - Last Heart Rate in normal range    Pulse Readings from Last 1 Encounters:  08/28/17 88            ALPRAZolam (XANAX) 0.25 MG tablet [Pharmacy Med Name: ALPRAZolam 0.25 MG Oral Tablet] 60 tablet 0    Sig: Take 1 tablet by mouth twice daily as needed for anxiety      Not Delegated - Psychiatry:  Anxiolytics/Hypnotics Failed - 08/01/2019  4:26 PM      Failed - This refill cannot be delegated      Failed - Urine Drug  Screen completed in last 360 days.      Failed - Valid encounter within last 6 months    Recent Outpatient Visits           9 months ago [place] Jerrol Banana., MD   1 year ago [place] Jerrol Banana., MD   3 years ago [place] Jerrol Banana., MD   3 years ago [place] Jerrol Banana., MD   4 years ago [place] Jerrol Banana., MD

## 2019-12-10 ENCOUNTER — Other Ambulatory Visit: Payer: Self-pay

## 2019-12-10 ENCOUNTER — Ambulatory Visit (INDEPENDENT_AMBULATORY_CARE_PROVIDER_SITE_OTHER): Payer: BC Managed Care – PPO | Admitting: Family Medicine

## 2019-12-10 ENCOUNTER — Encounter: Payer: Self-pay | Admitting: Family Medicine

## 2019-12-10 VITALS — BP 145/84 | HR 94 | Temp 98.6°F | Resp 16 | Ht 66.0 in | Wt 161.0 lb

## 2019-12-10 DIAGNOSIS — F3342 Major depressive disorder, recurrent, in full remission: Secondary | ICD-10-CM | POA: Diagnosis not present

## 2019-12-10 DIAGNOSIS — I10 Essential (primary) hypertension: Secondary | ICD-10-CM | POA: Diagnosis not present

## 2019-12-10 DIAGNOSIS — G243 Spasmodic torticollis: Secondary | ICD-10-CM | POA: Diagnosis not present

## 2019-12-10 DIAGNOSIS — F419 Anxiety disorder, unspecified: Secondary | ICD-10-CM

## 2019-12-10 MED ORDER — METOPROLOL SUCCINATE ER 50 MG PO TB24: ORAL_TABLET | ORAL | 0 refills | Status: DC

## 2019-12-10 MED ORDER — METOPROLOL SUCCINATE ER 50 MG PO TB24: ORAL_TABLET | ORAL | 12 refills | Status: DC

## 2019-12-10 MED ORDER — AMLODIPINE BESYLATE 5 MG PO TABS: 5.0000 mg | ORAL_TABLET | Freq: Every day | ORAL | 12 refills | Status: DC

## 2019-12-10 MED ORDER — ALPRAZOLAM 0.25 MG PO TABS: ORAL_TABLET | ORAL | 3 refills | Status: DC

## 2019-12-10 MED ORDER — CYCLOBENZAPRINE HCL 10 MG PO TABS: 10.0000 mg | ORAL_TABLET | Freq: Every day | ORAL | 5 refills | Status: DC

## 2019-12-10 NOTE — Progress Notes (Signed)
I,[name],acting as a scribe for Catherine Durie, MD.,have documented all relevant documentation on the behalf of Catherine Durie, MD,as directed by  Catherine Durie, MD while in the presence of Catherine Durie, MD.   Established patient visit   Patient: Catherine Herrera   DOB: [birth year]   54 y.o. Female  MRN: [id number] Visit Date: 12/10/2019  Today's healthcare provider: [name], MD   Chief Complaint  Patient presents with   Anxiety   Follow-up   Hypertension   Subjective    HPI  Patient comes in today for follow-up.  She needs refills on metoprolol, Flexeril, and Xanax.  She has not seen [facility] for her torticollis in some time.  This is a chronic problem. She is having menopausal symptoms but wants to see her gynecologist for this.  Overall she is doing fairly well.  She is married and her husband has had a kidney transplant but is stable and they have 1 son who is a PhD Ship broker at [employer]. Hypertension, follow-up  BP Readings from Last 3 Encounters:  12/10/19 (!) 145/84  08/28/17 (!) 152/88  03/14/16 (!) 152/92   Wt Readings from Last 3 Encounters:  12/10/19 161 lb (73 kg)  08/28/17 162 lb (73.5 kg)  03/14/16 164 lb (74.4 kg)     She was last seen for hypertension 08/28/2017.  BP at that visit was 152/88. Management since that visit includes; on metoprolol. She reports fair compliance with treatment. She is not having side effects. none She is exercising. She is not adherent to low salt diet.   Outside blood pressures are not checking.  She does not smoke.  Use of agents associated with hypertension: none.   -------------------------------------------------------------------- . Anxiety, Follow-up  She was last seen for anxiety 08/28/2017. Changes made at last visit include; Discussed trying to limit use of Xanax.   She reports good compliance with treatment. She reports good tolerance of treatment. She is not  having side effects. none  She feels her anxiety is moderate and Unchanged since last visit.  Symptoms: No chest pain No difficulty concentrating  No dizziness No fatigue  No feelings of losing control No insomnia  No irritable No palpitations  No panic attacks No racing thoughts  No shortness of breath No sweating  No tremors/shakes    GAD-7 Results No flowsheet data found.  PHQ-9 Scores PHQ9 SCORE ONLY 01/12/2016  PHQ-9 Total Score 3    --------------------------------------------------------------------      Medications: Outpatient Medications Prior to Visit  Medication Sig   ALPRAZolam (XANAX) 0.25 MG tablet Take 1 tablet by mouth twice daily as needed for anxiety   cyclobenzaprine (FLEXERIL) 10 MG tablet TAKE 1 TABLET BY MOUTH AT BEDTIME   metoprolol succinate (TOPROL-XL) 50 MG 24 hr tablet TAKE 1 TABLET BY MOUTH ONCE DAILY .TAKE  WITH  OR  IMMEDIATELY  FOLLOWING  MEAL   cyclobenzaprine (FLEXERIL) 10 MG tablet Take 1 tablet (10 mg total) by mouth at bedtime. (Patient not taking: Reported on 12/10/2019)   metoprolol succinate (TOPROL-XL) 50 MG 24 hr tablet TAKE 1 TABLET BY MOUTH ONCE DAILY. TAKE WITH OR IMMEDIATELY FOLLOWING A MEAL. (Patient not taking: Reported on 12/10/2019)   metoprolol succinate (TOPROL-XL) 50 MG 24 hr tablet TAKE 1 TABLET BY MOUTH ONCE DAILY. TAKE WITH OR IMMEDIATELY FOLLOWING MEAL. (Patient not taking: Reported on 12/10/2019)   sertraline (ZOLOFT) 50 MG tablet Take 1 tablet (50 mg total) by mouth daily. (Patient  not taking: Reported on 08/28/2017)   No facility-administered medications prior to visit.    Review of Systems  Constitutional: Negative for appetite change, chills, fatigue and fever.  Respiratory: Negative for chest tightness and shortness of breath.   Cardiovascular: Negative for chest pain and palpitations.  Gastrointestinal: Negative for abdominal pain, nausea and vomiting.  Neurological: Negative for dizziness and weakness.         Objective    BP (!) 145/84 (BP Location: Left Arm, Patient Position: Sitting, Cuff Size: Large)    Pulse 94    Temp 98.6 F (37 C) (Oral)    Resp 16    Ht 5\' 6"  (1.676 m)    Wt 161 lb (73 kg)    SpO2 100%    BMI 25.99 kg/m  BP Readings from Last 3 Encounters:  12/10/19 (!) 145/84  08/28/17 (!) 152/88  03/14/16 (!) 152/92   Wt Readings from Last 3 Encounters:  12/10/19 161 lb (73 kg)  08/28/17 162 lb (73.5 kg)  03/14/16 164 lb (74.4 kg)      Physical Exam Vitals reviewed.  HENT:     Head: Normocephalic and atraumatic.     Right Ear: External ear normal.     Left Ear: External ear normal.  Eyes:     General: No scleral icterus.    Conjunctiva/sclera: Conjunctivae normal.  Cardiovascular:     Rate and Rhythm: Normal rate and regular rhythm.     Pulses: Normal pulses.     Heart sounds: Normal heart sounds.  Pulmonary:     Effort: Pulmonary effort is normal.     Breath sounds: Normal breath sounds.  Abdominal:     Palpations: Abdomen is soft.  Musculoskeletal:     Right lower leg: No edema.     Left lower leg: No edema.  Skin:    General: Skin is dry.  Neurological:     General: No focal deficit present.     Mental Status: She is alert and oriented to person, place, and time.  Psychiatric:        Mood and Affect: Mood normal.        Behavior: Behavior normal.        Thought Content: Thought content normal.        Judgment: Judgment normal.       No results found for any visits on 12/10/19.  Assessment & Plan     1. Essential hypertension Refill metoprolol and add amlodipine.  Follow-up in 1 to 2 months. - amLODipine (NORVASC) 5 MG tablet; Take 1 tablet (5 mg total) by mouth daily.  Dispense: 30 tablet; Refill: 12 - metoprolol succinate (TOPROL-XL) 50 MG 24 hr tablet; TAKE 1 TABLET BY MOUTH ONCE DAILY .TAKE  WITH  OR  IMMEDIATELY  FOLLOWING  MEAL  Dispense: 30 tablet; Refill: 0 - metoprolol succinate (TOPROL-XL) 50 MG 24 hr tablet; TAKE 1 TABLET BY  MOUTH ONCE DAILY. TAKE WITH OR IMMEDIATELY FOLLOWING A MEAL.  Dispense: 30 tablet; Refill: 12  2. Spasmodic torticollis Stable on Flexeril only. - cyclobenzaprine (FLEXERIL) 10 MG tablet; Take 1 tablet (10 mg total) by mouth at bedtime.  Dispense: 30 tablet; Refill: 5  3. Anxiety Advised her to try to cut down on Xanax use which she takes infrequently.  She states maybe once or twice a week. - ALPRAZolam (XANAX) 0.25 MG tablet; Take 1 tablet by mouth twice daily as needed for anxiety  Dispense: 60 tablet; Refill: 3  4. Recurrent major depressive  disorder, in full remission ([place]) In remission, PHQ-9 on next visit.   No follow-ups on file.         Richard Cranford Mon, MD  [facility] [phone] (phone) [phone] (fax)  [place]

## 2020-01-06 ENCOUNTER — Telehealth: Payer: Self-pay

## 2020-01-06 DIAGNOSIS — I10 Essential (primary) hypertension: Secondary | ICD-10-CM

## 2020-01-06 MED ORDER — METOPROLOL SUCCINATE ER 50 MG PO TB24: ORAL_TABLET | ORAL | 12 refills | Status: DC

## 2020-01-06 NOTE — Telephone Encounter (Signed)
Copied from [place] [phone]. Topic: General - Other >> Jan 06, 2020 11:11 AM Hinda Lenis D wrote: PT need to speak with a nurse, she drop her blood pressure notes an was expecting a call back / please advise

## 2020-01-06 NOTE — Telephone Encounter (Signed)
Returned call to patient. Patient left her blood pressures readings for providers review. Advised Dr. Rosanna Randy is out of the office this week. Patient stated she is going to hold off on starting amlodipine until she hears back from [place]. Please advise?

## 2020-01-13 ENCOUNTER — Telehealth: Payer: Self-pay

## 2020-01-13 ENCOUNTER — Other Ambulatory Visit: Payer: Self-pay | Admitting: *Deleted

## 2020-01-13 DIAGNOSIS — Z1231 Encounter for screening mammogram for malignant neoplasm of breast: Secondary | ICD-10-CM

## 2020-01-13 NOTE — Telephone Encounter (Signed)
Copied from [place] [phone]. Topic: General - Other >> Jan 10, 2020  9:03 AM [name] wrote: Reason for CRM: Patient called to request that Dr Rosanna Randy send a referral to the [facility] so that she can have a mammogram. Can be reached at Ph# [phone]

## 2020-01-13 NOTE — Telephone Encounter (Signed)
Referral placed.

## 2020-01-15 NOTE — Telephone Encounter (Signed)
Please advise 

## 2020-01-15 NOTE — Telephone Encounter (Signed)
PT calling to F/UP  °

## 2020-01-16 ENCOUNTER — Ambulatory Visit
Admission: RE | Admit: 2020-01-16 | Discharge: 2020-01-16 | Disposition: A | Discharge status: Home / Self Care | Payer: BC Managed Care – PPO | Source: Ambulatory Visit | Attending: Family Medicine | Admitting: Family Medicine

## 2020-01-16 ENCOUNTER — Other Ambulatory Visit: Payer: Self-pay

## 2020-01-16 DIAGNOSIS — Z1231 Encounter for screening mammogram for malignant neoplasm of breast: Secondary | ICD-10-CM | POA: Insufficient documentation

## 2020-01-21 ENCOUNTER — Ambulatory Visit: Payer: Self-pay | Admitting: Family Medicine

## 2020-01-22 NOTE — Telephone Encounter (Signed)
I have not seen blood pressures.

## 2020-01-29 NOTE — Telephone Encounter (Signed)
Lmtcb, with new BP readings.

## 2020-06-09 ENCOUNTER — Other Ambulatory Visit: Payer: Self-pay | Admitting: Family Medicine

## 2020-06-09 DIAGNOSIS — G243 Spasmodic torticollis: Secondary | ICD-10-CM

## 2020-06-09 NOTE — Telephone Encounter (Signed)
Requested medication (s) are due for refill today: yes  Requested medication (s) are on the active medication list: yes  Last refill:  05/10/20  Future visit scheduled: no  Notes to clinic:  not delegated  Requested Prescriptions  Pending Prescriptions Disp Refills   cyclobenzaprine (FLEXERIL) 10 MG tablet [Pharmacy Med Name: Cyclobenzaprine HCl 10 MG Oral Tablet] 30 tablet 0    Sig: TAKE 1 TABLET BY MOUTH AT BEDTIME      Not Delegated - Analgesics:  Muscle Relaxants Failed - 06/09/2020  7:49 AM      Failed - This refill cannot be delegated      Failed - Valid encounter within last 6 months    Recent Outpatient Visits           6 months ago Essential hypertension   [facility] Jerrol Banana., MD   1 year ago Anxiety   [facility] Jerrol Banana., MD   2 years ago [place] Jerrol Banana., MD   4 years ago [place] Jerrol Banana., MD   4 years ago [place] Jerrol Banana., MD

## 2020-07-08 ENCOUNTER — Other Ambulatory Visit: Payer: Self-pay | Admitting: Family Medicine

## 2020-07-08 DIAGNOSIS — G243 Spasmodic torticollis: Secondary | ICD-10-CM

## 2021-01-06 ENCOUNTER — Other Ambulatory Visit: Payer: Self-pay | Admitting: Family Medicine

## 2021-01-06 DIAGNOSIS — I10 Essential (primary) hypertension: Secondary | ICD-10-CM

## 2021-01-06 NOTE — Telephone Encounter (Signed)
Pt called, advised that she needs an appt to refill metoprolol. She states that she called [facility] on Monday and had 1 refill left that they were suppose to refill on Tuesday but didn't and now she doesn't have any refills left. Attempted to schedule her an appt for HTN mgmt but she's dealing with her dad being sick and at home with hospice so is unsure when she could come in for visit. Advised I will send this over to Dr. Rosanna Randy and he can decide if refill appropriate or not. If appt is still needed, we can call her back at another time. Pt verbalized understanding.

## 2021-01-11 NOTE — Telephone Encounter (Signed)
Patient needs an appointment for future refills.  Please schedule.

## 2021-01-11 NOTE — Telephone Encounter (Signed)
See below.  Refill if appropriate.  Please advise.

## 2021-01-11 NOTE — Telephone Encounter (Signed)
Patient's father passed away last night and she doesn't know when she can come in.

## 2021-02-01 IMAGING — MG DIGITAL SCREENING BILAT W/ TOMO W/ CAD
8 series · 8 of 24 positions shown · non-contrast
Comparison: Previous exam(s).

CLINICAL DATA: Screening.

EXAM:
DIGITAL SCREENING BILATERAL MAMMOGRAM WITH TOMO AND CAD

[L MLO synth-2D]
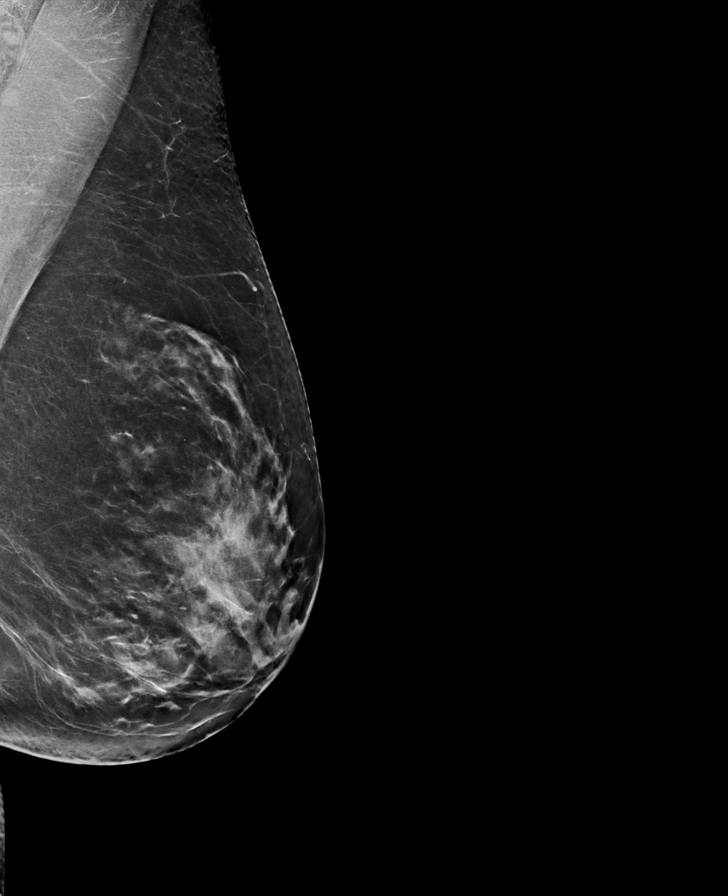

[R CC synth-2D]
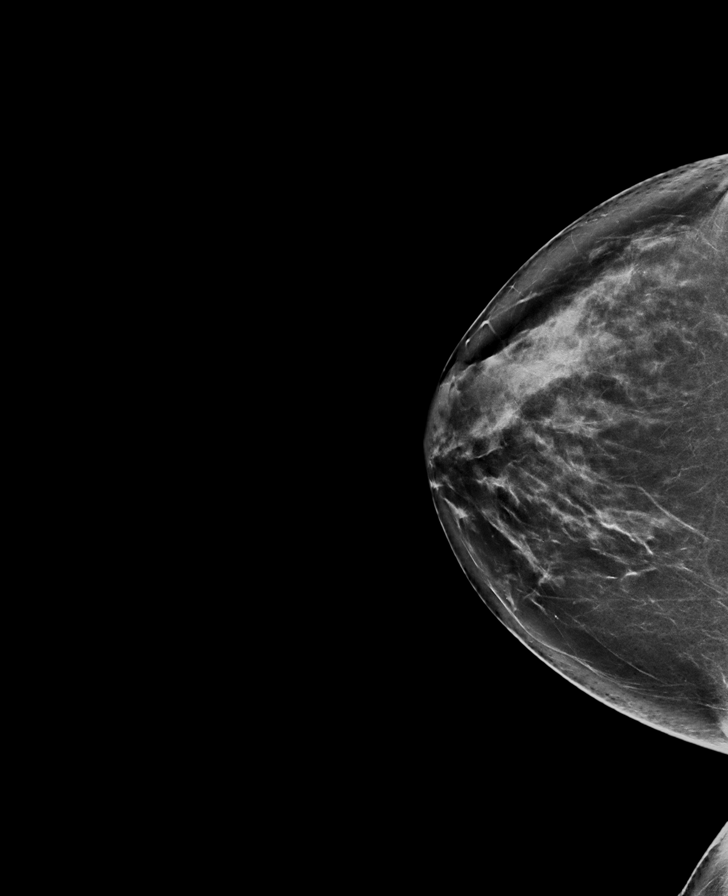

[L CC synth-2D]
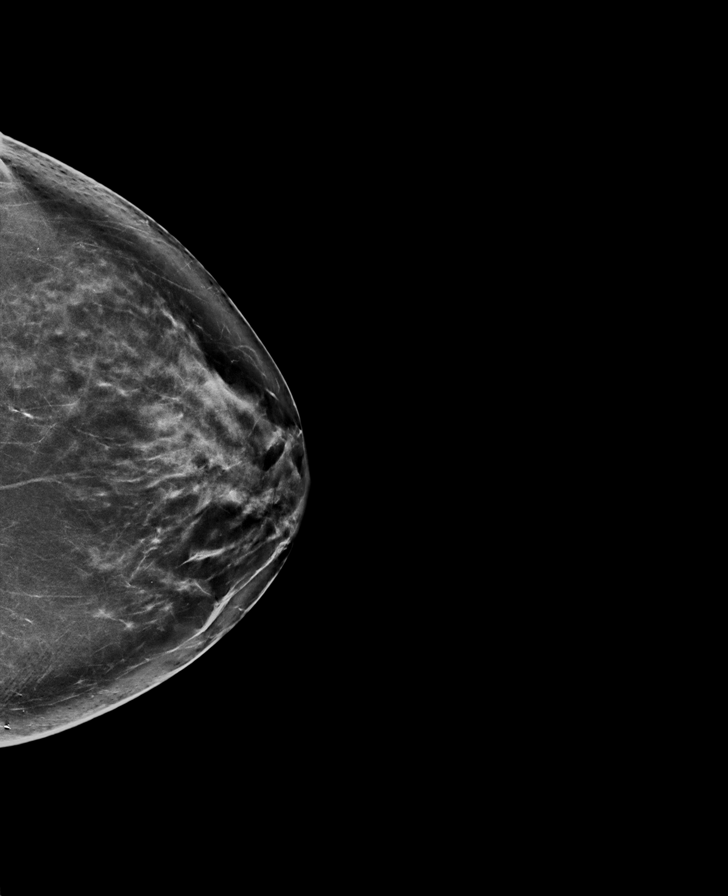

[R MLO synth-2D]
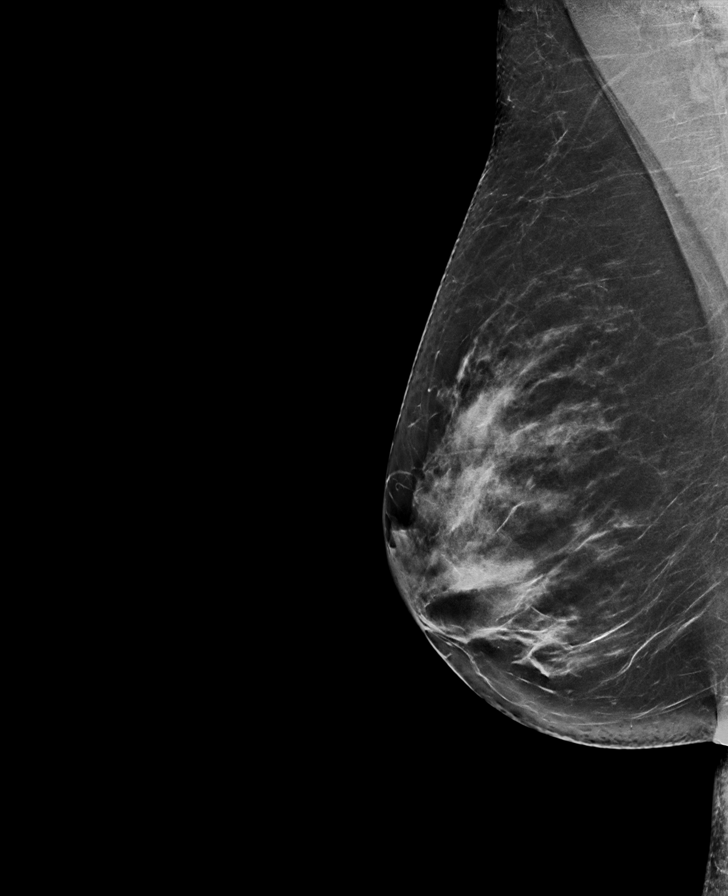

[R CC tomo · tomo slice 43/85.0]
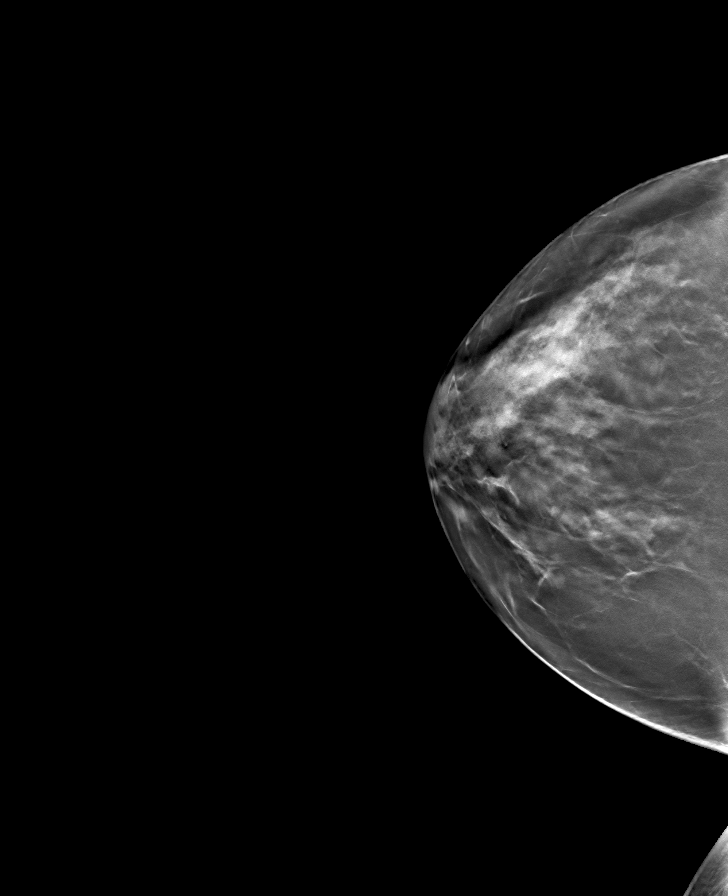

[R MLO tomo · tomo slice 41/80.0]
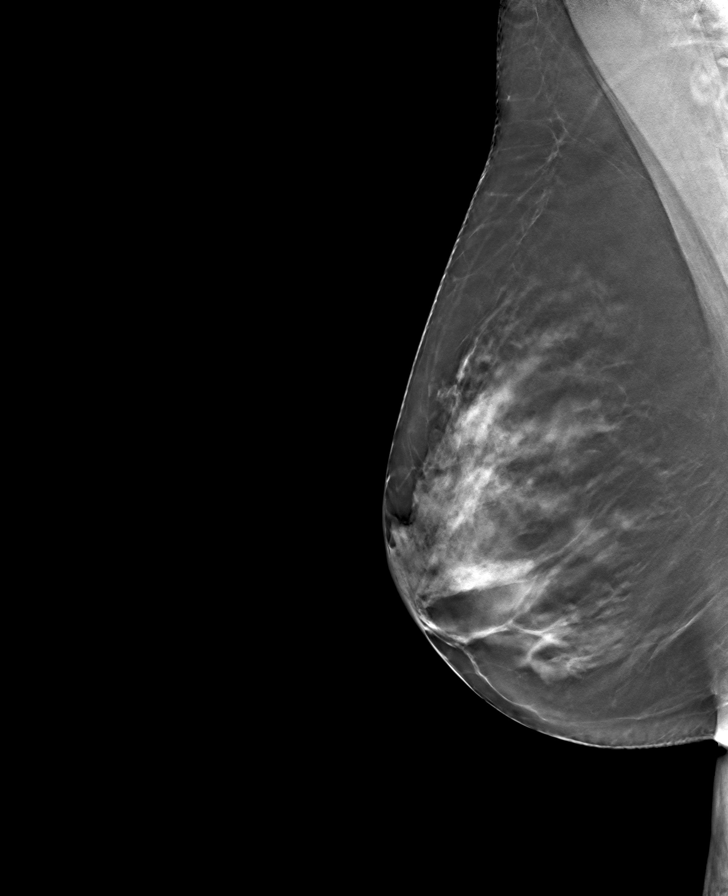

[L MLO tomo · tomo slice 41/81.0]
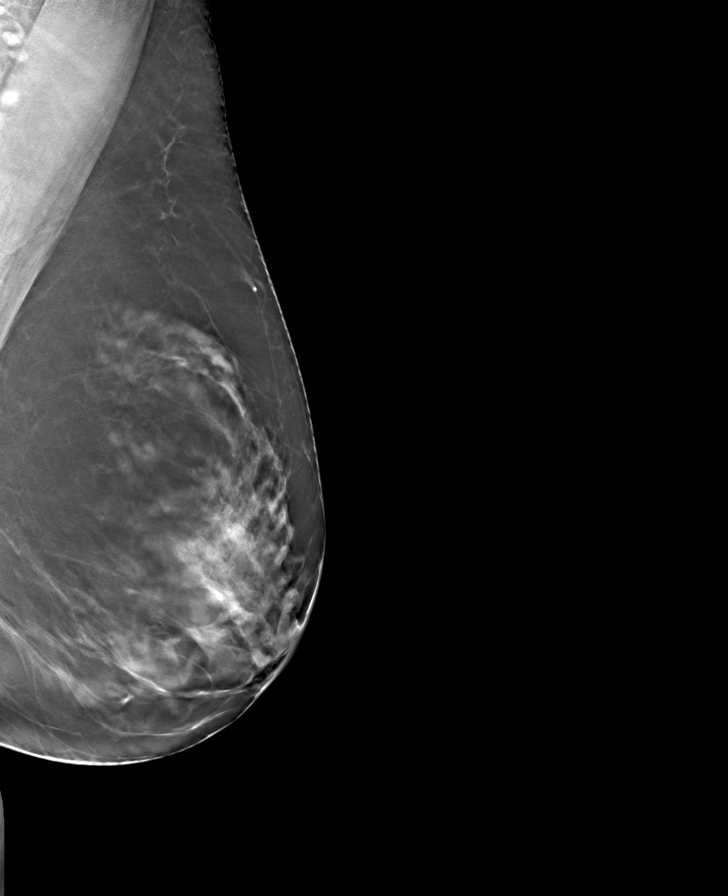

[L CC tomo · tomo slice 43/84.0]
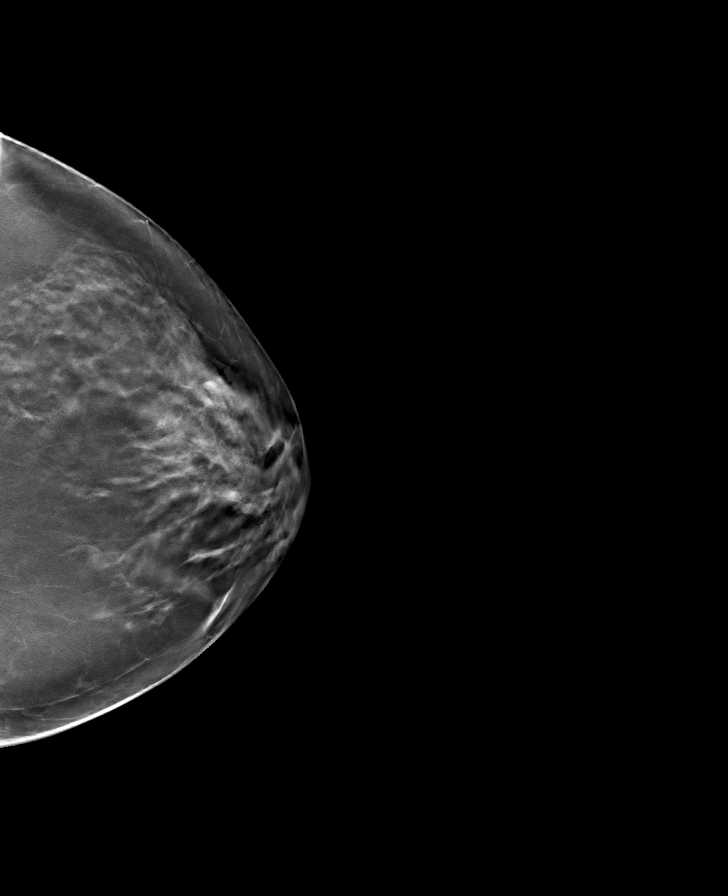

[8 of 24 positions shown; findings below may reference images not displayed]

ACR Breast Density Category c: The breast tissue is heterogeneously
dense, which may obscure small masses.
FINDINGS: There are no findings suspicious for malignancy. Images were
processed with CAD.
IMPRESSION: No mammographic evidence of malignancy. A result letter of this
screening mammogram will be mailed directly to the patient.

RECOMMENDATION:
Screening mammogram in one year. (Code:FT-U-LHB)

BI-RADS CATEGORY  1: Negative.

## 2021-04-06 ENCOUNTER — Encounter: Payer: Self-pay | Admitting: Physician Assistant

## 2021-04-06 ENCOUNTER — Other Ambulatory Visit: Payer: Self-pay

## 2021-04-06 ENCOUNTER — Ambulatory Visit (INDEPENDENT_AMBULATORY_CARE_PROVIDER_SITE_OTHER): Payer: BC Managed Care – PPO | Admitting: Physician Assistant

## 2021-04-06 DIAGNOSIS — I10 Essential (primary) hypertension: Secondary | ICD-10-CM

## 2021-04-06 DIAGNOSIS — G243 Spasmodic torticollis: Secondary | ICD-10-CM

## 2021-04-06 DIAGNOSIS — F419 Anxiety disorder, unspecified: Secondary | ICD-10-CM | POA: Diagnosis not present

## 2021-04-06 MED ORDER — ALPRAZOLAM 0.25 MG PO TABS: ORAL_TABLET | ORAL | 2 refills | Status: DC

## 2021-04-06 MED ORDER — METOPROLOL SUCCINATE ER 50 MG PO TB24: ORAL_TABLET | ORAL | 3 refills | Status: DC

## 2021-04-06 MED ORDER — CYCLOBENZAPRINE HCL 10 MG PO TABS: 10.0000 mg | ORAL_TABLET | Freq: Every day | ORAL | 3 refills | Status: DC

## 2021-04-06 NOTE — Progress Notes (Signed)
Established Patient Office Visit  Subjective:  Patient ID: MAREESA Herrera, female    DOB: 15-Mar-1965  Age: 56 y.o. MRN: [id number]  CC:  Chief Complaint  Patient presents with   Hypertension    Patient reports that she has been checking her blood pressure at home. Systolic has been ranging from 01-027 and Diastolic readings 25-36.    Anxiety    HPI  Hypertension, follow-up  BP Readings from Last 3 Encounters:  04/06/21 (!) 145/90  12/10/19 (!) 145/84  08/28/17 (!) 152/88   Wt Readings from Last 3 Encounters:  04/06/21 157 lb (71.2 kg)  12/10/19 161 lb (73 kg)  08/28/17 162 lb (73.5 kg)     She was last seen for hypertension 2 years ago.  BP at that visit was 145/90. Management since that visit includes metoprolol..  She reports good compliance with treatment. She is not having side effects.  She is following a Regular diet. She is not exercising. She does not smoke.  Use of agents associated with hypertension: none.   Outside blood pressures are see above. Symptoms: No chest pain No chest pressure  No palpitations No syncope  No dyspnea No orthopnea  No paroxysmal nocturnal dyspnea No lower extremity edema   Pertinent labs: Lab Results  Component Value Date   CHOL 222 (A) 08/08/2012   HDL 74 (A) 08/08/2012   LDLCALC 130 08/08/2012   TRIG 89 08/08/2012   Lab Results  Component Value Date   NA 140 01/12/2016   K 4.0 01/12/2016   CREATININE 1.17 (H) 01/12/2016   GFRNONAA 54 (L) 01/12/2016   GLUCOSE 81 01/12/2016   TSH 3.140 01/12/2016     The ASCVD Risk score (Arnett DK, et al., 2019) failed to calculate for the following reasons:   Cannot find a previous HDL lab   Cannot find a previous total cholesterol lab      Anxiety, Follow-up  She was last seen for anxiety 2 years ago. Changes made at last visit include none.   She reports good compliance with treatment. She reports good tolerance of treatment. She is not having side effects.She  has been taking Xanax   She feels her anxiety is severe without her medication and Improved on medication.  On 04/06/21 scire 9 on GAD-7   GAD-7 Results GAD-7 Generalized Anxiety Disorder Screening Tool 12/10/2019  1. Feeling Nervous, Anxious, or on Edge 1  2. Not Being Able to Stop or Control Worrying 0  3. Worrying Too Much About Different Things 0  4. Trouble Relaxing 1  5. Being So Restless it's Hard To Sit Still 0  6. Becoming Easily Annoyed or Irritable 0  7. Feeling Afraid As If Something Awful Might Happen 0  Total GAD-7 Score 2  Difficulty At Work, Home, or Getting  Along With Others? Not difficult at all    PHQ-9 Scores PHQ9 SCORE ONLY 04/06/2021 12/10/2019 01/12/2016  PHQ-9 Total Score 2 2 3     Medication refill Jarome Lamas presents for medication refill. 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 Rosanna Randy, her primary medical provider. She was last seen in clinic in 2021.? She was out of her medications for the last two weeks.    Past Surgical History:  Procedure Laterality Date   CHOLECYSTECTOMY     COLONOSCOPY WITH PROPOFOL N/A 03/23/2015   Procedure: COLONOSCOPY WITH PROPOFOL;  Surgeon: Lucilla Lame, MD;  Location:  [place];  Service: Endoscopy;  Laterality: N/A;   POLYPECTOMY  03/23/2015   Procedure: POLYPECTOMY;  Surgeon: Lucilla Lame, MD;  Location: [place];  Service: Endoscopy;;    Family History  Problem Relation Age of Onset   Hypertension Mother    Hypothyroidism Mother    Hypertension Father    Prostate cancer Father    Hyperthyroidism Sister    Gallbladder disease Sister    Multiple sclerosis Maternal Grandmother    Heart attack Maternal Grandfather    Heart disease Maternal Grandfather    Breast cancer Neg Hx     Social History   Socioeconomic History   Marital status: Married    Spouse name: Not on file   Number of children:  Not on file   Years of education: Not on file   Highest education level: Not on file  Occupational History   Not on file  Tobacco Use   Smoking status: Never   Smokeless tobacco: Never  Substance and Sexual Activity   Alcohol use: Yes   Drug use: No   Sexual activity: Not on file  Other Topics Concern   Not on file  Social History Narrative   Not on file   Social Determinants of Health   Financial Resource Strain: Not on file  Food Insecurity: Not on file  Transportation Needs: Not on file  Physical Activity: Not on file  Stress: Not on file  Social Connections: Not on file  Intimate Partner Violence: Not on file    Outpatient Medications Prior to Visit  Medication Sig Dispense Refill   amLODipine (NORVASC) 5 MG tablet Take 1 tablet (5 mg total) by mouth daily. (Patient not taking: Reported on 04/06/2021) 30 tablet 12   sertraline (ZOLOFT) 50 MG tablet Take 1 tablet (50 mg total) by mouth daily. (Patient not taking: Reported on 08/28/2017) 30 tablet 3   ALPRAZolam (XANAX) 0.25 MG tablet Take 1 tablet by mouth twice daily as needed for anxiety (Patient not taking: Reported on 04/06/2021) 60 tablet 3   cyclobenzaprine (FLEXERIL) 10 MG tablet Take 1 tablet (10 mg total) by mouth at bedtime. (Patient not taking: Reported on 12/10/2019) 30 tablet 11   cyclobenzaprine (FLEXERIL) 10 MG tablet TAKE 1 TABLET BY MOUTH AT BEDTIME (Patient not taking: Reported on 04/06/2021) 30 tablet 4   metoprolol succinate (TOPROL-XL) 50 MG 24 hr tablet TAKE 1 TABLET BY MOUTH ONCE DAILY .TAKE  WITH  OR  IMMEDIATELY  FOLLOWING  MEAL (Patient not taking: Reported on 04/06/2021) 30 tablet 0   metoprolol succinate (TOPROL-XL) 50 MG 24 hr tablet TAKE 1 TABLET BY MOUTH ONCE DAILY -  TAKE  WITH  OR  IMMEDIATELY  FOLLOWING  A  MEAL (Patient not taking: Reported on 04/06/2021) 30 tablet 0   No facility-administered medications prior to visit.    No Known Allergies  ROS Review of Systems   Constitutional:  Negative for fatigue.  Respiratory:  Negative for shortness of breath.   Cardiovascular:  Negative for chest pain and palpitations.  Musculoskeletal:  Positive for neck pain and neck stiffness.  Psychiatric/Behavioral:  Positive for agitation. The patient is nervous/anxious.   All other systems reviewed and are negative.   Except see HPI  Objective:    Physical Exam Vitals reviewed.  Constitutional:      Appearance: Normal appearance.  HENT:     Head: Normocephalic and atraumatic.  Eyes:     Extraocular Movements: Extraocular movements intact.     Pupils:  Pupils are equal, round, and reactive to light.  Cardiovascular:     Rate and Rhythm: Normal rate and regular rhythm.     Pulses: Normal pulses.     Heart sounds: Normal heart sounds.  Pulmonary:     Effort: Pulmonary effort is normal.     Breath sounds: Normal breath sounds.  Musculoskeletal:     Cervical back: Rigidity present.  Neurological:     General: No focal deficit present.     Mental Status: She is alert and oriented to person, place, and time.  Psychiatric:        Behavior: Behavior normal.        Thought Content: Thought content normal.        Judgment: Judgment normal.    BP (!) 145/90    Pulse (!) 120    Resp 16    Wt 157 lb (71.2 kg)    LMP 12/06/2016    SpO2 99%    BMI 25.34 kg/m  Wt Readings from Last 3 Encounters:  04/06/21 157 lb (71.2 kg)  12/10/19 161 lb (73 kg)  08/28/17 162 lb (73.5 kg)     Health Maintenance Due  Topic Date Due   COVID-19 Vaccine (1) Never done   HIV Screening  Never done   Hepatitis C Screening  Never done   Zoster Vaccines- Shingrix (1 of 2) Never done   PAP SMEAR-Modifier  12/10/2017   INFLUENZA VACCINE  09/14/2020    There are no preventive care reminders to display for this patient.  Lab Results  Component Value Date   TSH 3.140 01/12/2016   Lab Results  Component Value Date   WBC 6.1 01/12/2016   HGB 12.4 01/12/2016   HCT 37.2  01/12/2016   MCV 91 01/12/2016   PLT 249 01/12/2016   Lab Results  Component Value Date   NA 140 01/12/2016   K 4.0 01/12/2016   CO2 23 01/12/2016   GLUCOSE 81 01/12/2016   BUN 11 01/12/2016   CREATININE 1.17 (H) 01/12/2016   BILITOT <0.2 01/12/2016   ALKPHOS 84 01/12/2016   AST 21 01/12/2016   ALT 14 01/12/2016   PROT 8.5 01/12/2016   ALBUMIN 4.5 01/12/2016   CALCIUM 9.6 01/12/2016   Lab Results  Component Value Date   CHOL 222 (A) 08/08/2012   Lab Results  Component Value Date   HDL 74 (A) 08/08/2012   Lab Results  Component Value Date   LDLCALC 130 08/08/2012   Lab Results  Component Value Date   TRIG 89 08/08/2012   No results found for: CHOLHDL No results found for: HGBA1C    Assessment & Plan:   Problem List Items Addressed This Visit       Nervous and Auditory   Spasmodic torticollis   Relevant Medications   cyclobenzaprine (FLEXERIL) 10 MG tablet     Other   Anxiety   Relevant Medications   ALPRAZolam (XANAX) 0.25 MG tablet   Other Visit Diagnoses     Essential hypertension       Relevant Medications   metoprolol succinate (TOPROL-XL) 50 MG 24 hr tablet       HTN -Metoprolol was prescribed for the next 3 months -Recommended low-salt diet and exercise -BP log.  Cervical torticollis -I discussed this patient with Dr. Rosanna Randy.  -flexeril and xanax were prescribed for 3 months,  while a follow-up was scheduled with Dr. Rosanna Randy for continuity of care.  Anxiety - GAD 7 score was 9 today.  -  Continue her medication - FU in 3 mo  Follow-up: In 3 mo with Dr Rosanna Randy    I discussed the assessment and treatment plan with the patient. The patient was provided an opportunity to ask questions and all were answered. The patient agreed with the plan and demonstrated an understanding of the instructions.   The patient was advised to call back or seek an in-person evaluation if the symptoms worsen or if the condition fails to improve as  anticipated.   I, Mardene Speak, PA-C, have reviewed all documentation for this visit. The documentation on 04/06/21 for the exam, diagnosis, procedures, and orders are all accurate and complete.  Mardene Speak, PA-C [facility] [phone] (phone) [phone] (fax)   [place]

## 2021-07-05 ENCOUNTER — Other Ambulatory Visit: Payer: Self-pay | Admitting: Physician Assistant

## 2021-07-05 ENCOUNTER — Ambulatory Visit: Payer: BC Managed Care – PPO | Admitting: Family Medicine

## 2021-07-05 DIAGNOSIS — F419 Anxiety disorder, unspecified: Secondary | ICD-10-CM

## 2021-08-09 ENCOUNTER — Other Ambulatory Visit: Payer: Self-pay | Admitting: Physician Assistant

## 2021-08-09 DIAGNOSIS — G243 Spasmodic torticollis: Secondary | ICD-10-CM

## 2021-08-09 DIAGNOSIS — I10 Essential (primary) hypertension: Secondary | ICD-10-CM

## 2021-08-09 NOTE — Telephone Encounter (Signed)
Cyclobenzaprine  LOV 04-06-21 NOV none LR 04-06-21 #30 with 3 refills

## 2021-08-11 NOTE — Telephone Encounter (Signed)
Patient called, left VM to return the call to the office to schedule OV for refills.

## 2021-08-11 NOTE — Telephone Encounter (Signed)
Requested medication (s) are due for refill today: Yes  Requested medication (s) are on the active medication list: Yes  Last refill:  04/06/21  Future visit scheduled: No  Notes to clinic:  Unable to refill per protocol, cannot delegate, appointment needed      Requested Prescriptions  Pending Prescriptions Disp Refills   cyclobenzaprine (FLEXERIL) 10 MG tablet [Pharmacy Med Name: Cyclobenzaprine HCl 10 MG Oral Tablet] 30 tablet 0    Sig: TAKE 1 TABLET BY MOUTH AT BEDTIME     Not Delegated - Analgesics:  Muscle Relaxants Failed - 08/11/2021  3:55 PM      Failed - This refill cannot be delegated      Passed - Valid encounter within last 6 months    Recent Outpatient Visits           4 months ago Essential hypertension   [place], [place], PA-C   1 year ago Essential hypertension   [facility] Jerrol Banana., MD   2 years ago [place] Jerrol Banana., MD   3 years ago [place] Jerrol Banana., MD   5 years ago [place] Jerrol Banana., MD               metoprolol succinate (TOPROL-XL) 50 MG 24 hr tablet [Pharmacy Med Name: Metoprolol Succinate ER 50 MG Oral Tablet Extended Release 24 Hour] 30 tablet 0    Sig: TAKE 1 TABLET BY MOUTH ONCE DAILY TAKE  WITH  OR  IMMEDIATELY  FOLLOWING  A  MEAL     Cardiovascular:  Beta Blockers Failed - 08/11/2021  3:55 PM      Failed - Last BP in normal range    BP Readings from Last 1 Encounters:  04/06/21 (!) 145/90         Failed - Last Heart Rate in normal range    Pulse Readings from Last 1 Encounters:  04/06/21 (!) 120         Passed - Valid encounter within last 6 months    Recent Outpatient Visits           4 months ago Essential hypertension   [place], [place], PA-C   1 year ago Essential hypertension   [facility]  Jerrol Banana., MD   2 years ago Anxiety   [facility] Jerrol Banana., MD   3 years ago Actinic keratosis   [facility] Jerrol Banana., MD   5 years ago [place] Jerrol Banana., MD

## 2021-08-11 NOTE — Telephone Encounter (Signed)
[place] okay to [facility] triage to advise.

## 2021-08-16 ENCOUNTER — Telehealth: Payer: Self-pay | Admitting: Family Medicine

## 2021-08-16 NOTE — Telephone Encounter (Signed)
Patient is totally out of Metoprolol 50 mg. and Cyclobenzaprine 10 mg.   She is addament about not coming in for another appt. However, Letitia Libra did say on her last OV that she needed to f/u with Dr. Rosanna Randy since she has not been seen by him since 2021.   I offered her an appt. Today and this week and she said NO.    She wants these meds called in today to [facility] in [place].   Please let patient know if this is going to be done or not.

## 2021-08-16 NOTE — Telephone Encounter (Signed)
I had already sent in refills for her for a couple of months but then she needs to be seen.

## 2021-08-16 NOTE — Telephone Encounter (Signed)
Please Review

## 2021-12-01 ENCOUNTER — Other Ambulatory Visit: Payer: Self-pay | Admitting: Internal Medicine

## 2021-12-01 ENCOUNTER — Other Ambulatory Visit: Payer: Self-pay | Admitting: Physician Assistant

## 2021-12-01 ENCOUNTER — Other Ambulatory Visit: Payer: Self-pay | Admitting: Family Medicine

## 2021-12-01 DIAGNOSIS — Z1231 Encounter for screening mammogram for malignant neoplasm of breast: Secondary | ICD-10-CM

## 2021-12-16 ENCOUNTER — Ambulatory Visit
Admission: RE | Admit: 2021-12-16 | Discharge: 2021-12-16 | Disposition: A | Discharge status: Home / Self Care | Payer: BC Managed Care – PPO | Source: Ambulatory Visit | Attending: Physician Assistant | Admitting: Physician Assistant

## 2021-12-16 DIAGNOSIS — Z1231 Encounter for screening mammogram for malignant neoplasm of breast: Secondary | ICD-10-CM | POA: Diagnosis present

## 2021-12-20 NOTE — Progress Notes (Signed)
Normal mammogram. FU in a year

## 2021-12-24 ENCOUNTER — Other Ambulatory Visit: Payer: Self-pay | Admitting: Family Medicine

## 2021-12-24 DIAGNOSIS — I10 Essential (primary) hypertension: Secondary | ICD-10-CM

## 2021-12-24 MED ORDER — METOPROLOL SUCCINATE ER 50 MG PO TB24: ORAL_TABLET | ORAL | 0 refills | Status: DC

## 2021-12-24 NOTE — Telephone Encounter (Signed)
Medication Refill - Medication: metoprolol succinate (TOPROL-XL) 50 MG 24 hr tablet   Pt stated she has one tablet left and is requesting a courtesy to get her through to her scheduled appointment on 12/28/2021.  Has the patient contacted their pharmacy? No. No, more refills.   (Agent: If no, request that the patient contact the pharmacy for the refill. If patient does not wish to contact the pharmacy document the reason why and proceed with request.)   Preferred Pharmacy (with phone number or street name):  [place], [place] - [address]  [place] [address]  Phone: [phone] Fax: [phone]  Hours: Not open 24 hours   Has the patient been seen for an appointment in the last year OR does the patient have an upcoming appointment? Yes.    Agent: Please be advised that RX refills may take up to 3 business days. We ask that you follow-up with your pharmacy.

## 2021-12-24 NOTE — Telephone Encounter (Signed)
Requested medications are due for refill today.  yes  Requested medications are on the active medications list.  yes  Last refill. 08/16/2021 #30 2 rf  Future visit scheduled.   yes  Notes to clinic.  Dr. Rosanna Randy pt.    Requested Prescriptions  Pending Prescriptions Disp Refills   metoprolol succinate (TOPROL-XL) 50 MG 24 hr tablet 30 tablet 2    Sig: TAKE 1 TABLET BY MOUTH ONCE DAILY TAKE  WITH  OR  IMMEDIATELY  FOLLOWING  A  MEAL     Cardiovascular:  Beta Blockers Failed - 12/24/2021  3:24 PM      Failed - Last BP in normal range    BP Readings from Last 1 Encounters:  04/06/21 (!) 145/90         Failed - Last Heart Rate in normal range    Pulse Readings from Last 1 Encounters:  04/06/21 (!) 120         Failed - Valid encounter within last 6 months    Recent Outpatient Visits           8 months ago Essential hypertension   [employer], [place], PA-C   2 years ago Essential hypertension   [facility] Jerrol Banana., MD   3 years ago [place] Jerrol Banana., MD   4 years ago Actinic keratosis   [facility] Jerrol Banana., MD   5 years ago [place] Jerrol Banana., MD       Future Appointments             In 4 days Mardene Speak, PA-C [facility] [place]

## 2021-12-28 ENCOUNTER — Ambulatory Visit (INDEPENDENT_AMBULATORY_CARE_PROVIDER_SITE_OTHER): Payer: BC Managed Care – PPO | Admitting: Physician Assistant

## 2021-12-28 ENCOUNTER — Encounter: Payer: Self-pay | Admitting: Physician Assistant

## 2021-12-28 VITALS — BP 148/99 | HR 85 | Resp 16 | Ht 66.0 in | Wt 160.0 lb

## 2021-12-28 DIAGNOSIS — G243 Spasmodic torticollis: Secondary | ICD-10-CM

## 2021-12-28 DIAGNOSIS — I10 Essential (primary) hypertension: Secondary | ICD-10-CM

## 2021-12-28 DIAGNOSIS — F419 Anxiety disorder, unspecified: Secondary | ICD-10-CM

## 2021-12-28 DIAGNOSIS — E663 Overweight: Secondary | ICD-10-CM | POA: Diagnosis not present

## 2021-12-28 MED ORDER — CYCLOBENZAPRINE HCL 10 MG PO TABS: 10.0000 mg | ORAL_TABLET | Freq: Every day | ORAL | 1 refills | Status: DC

## 2021-12-28 MED ORDER — HYDROXYZINE PAMOATE 25 MG PO CAPS: 25.0000 mg | ORAL_CAPSULE | Freq: Three times a day (TID) | ORAL | 0 refills | Status: DC

## 2021-12-28 MED ORDER — ALPRAZOLAM 0.25 MG PO TABS: 0.2500 mg | ORAL_TABLET | Freq: Two times a day (BID) | ORAL | 1 refills | Status: DC | PRN

## 2021-12-28 NOTE — Progress Notes (Unsigned)
I,Tiffany J Bragg,acting as a Education administrator for [employer], PA-C.,have documented all relevant documentation on the behalf of Mardene Speak, PA-C,as directed by  [employer], PA-C while in the presence of [employer], PA-C.   Established patient visit   Patient: Catherine Herrera   DOB: 10/04/1965   56 y.o. Female  MRN: [id number] Visit Date: 12/28/2021  Today's healthcare provider: Mardene Speak, PA-C   Chief Complaint  Patient presents with   Anxiety   Hypertension   Subjective    HPI  Anxiety, Follow-up  She was last seen for anxiety 9 months ago. Changes made at last visit include no changes, continue xanax .'25mg'$ .   She reports excellent compliance with treatment. She reports excellent tolerance of treatment. She is not having side effects.   She feels her anxiety is moderate and Unchanged since last visit.  Symptoms: No chest pain No difficulty concentrating  No dizziness No fatigue  No feelings of losing control Yes insomnia  No irritable Yes palpitations  No panic attacks No racing thoughts  No shortness of breath No sweating  Yes tremors/shakes    GAD-7 Results    12/28/2021    3:39 PM 12/10/2019    3:47 PM  GAD-7 Generalized Anxiety Disorder Screening Tool  1. Feeling Nervous, Anxious, or on Edge 3 1  2. Not Being Able to Stop or Control Worrying 2 0  3. Worrying Too Much About Different Things 2 0  4. Trouble Relaxing 1 1  5. Being So Restless it's Hard To Sit Still 0 0  6. Becoming Easily Annoyed or Irritable 1 0  7. Feeling Afraid As If Something Awful Might Happen 0 0  Total GAD-7 Score 9 2  Difficulty At Work, Home, or Getting  Along With Others? Not difficult at all Not difficult at all    PHQ-9 Scores    12/28/2021    3:38 PM 04/06/2021    1:55 PM 12/10/2019    3:46 PM  PHQ9 SCORE ONLY  PHQ-9 Total Score '2 2 2   '$ Hypertension, follow-up  BP Readings from Last 3 Encounters:  12/28/21 (!) 148/99  04/06/21 (!) 145/90  12/10/19 (!)  145/84   Wt Readings from Last 3 Encounters:  12/28/21 160 lb (72.6 kg)  04/06/21 157 lb (71.2 kg)  12/10/19 161 lb (73 kg)     She was last seen for hypertension 9 months ago.  BP at that visit was 145/90. Management since that visit includes no changes.  She reports excellent compliance with treatment. She is not having side effects.  She is following a Regular diet. She is not exercising. She does not smoke.  Use of agents associated with hypertension: none.   Outside blood pressures are not checked regularly. Symptoms: No chest pain No chest pressure  No palpitations No syncope  No dyspnea No orthopnea  No paroxysmal nocturnal dyspnea No lower extremity edema   Pertinent labs Lab Results  Component Value Date   CHOL 222 (A) 08/08/2012   HDL 74 (A) 08/08/2012   LDLCALC 130 08/08/2012   TRIG 89 08/08/2012   Lab Results  Component Value Date   NA 140 01/12/2016   K 4.0 01/12/2016   CREATININE 1.17 (H) 01/12/2016   GFRNONAA 54 (L) 01/12/2016   GLUCOSE 81 01/12/2016   TSH 3.140 01/12/2016     The ASCVD Risk score (Arnett DK, et al., 2019) failed to calculate for the following reasons:   Cannot find a previous HDL lab  Cannot find a previous total cholesterol lab  ---------------------------------------------------------------------------------------------------  ---------------------------------------------------------------------------------------------------   Medications: Outpatient Medications Prior to Visit  Medication Sig   ALPRAZolam (XANAX) 0.25 MG tablet Take 1 tablet by mouth twice daily as needed for anxiety   cyclobenzaprine (FLEXERIL) 10 MG tablet TAKE 1 TABLET BY MOUTH AT BEDTIME   metoprolol succinate (TOPROL-XL) 50 MG 24 hr tablet TAKE 1 TABLET BY MOUTH ONCE DAILY TAKE  WITH  OR  IMMEDIATELY  FOLLOWING  A  MEAL   No facility-administered medications prior to visit.    Review of Systems  All other systems reviewed and are negative. Except  see HPI  {Labs  Heme  Chem  Endocrine  Serology  Results Review (optional):23779}   Objective    BP (!) 148/99 (BP Location: Right Arm, Patient Position: Sitting, Cuff Size: Normal)   Pulse 85   Resp 16   Ht '5\' 6"'$  (1.676 m)   Wt 160 lb (72.6 kg)   LMP 12/06/2016   SpO2 100%   BMI 25.82 kg/m  {Show previous vital signs (optional):23777}  Physical Exam Vitals reviewed.  Constitutional:      General: She is not in acute distress.    Appearance: Normal appearance. She is well-developed. She is not diaphoretic.  HENT:     Head: Normocephalic and atraumatic.  Eyes:     General: No scleral icterus.    Extraocular Movements: Extraocular movements intact.     Conjunctiva/sclera: Conjunctivae normal.     Pupils: Pupils are equal, round, and reactive to light.  Neck:     Thyroid: No thyromegaly.  Cardiovascular:     Rate and Rhythm: Normal rate and regular rhythm.     Pulses: Normal pulses.     Heart sounds: Normal heart sounds. No murmur heard. Pulmonary:     Effort: Pulmonary effort is normal. No respiratory distress.     Breath sounds: Normal breath sounds. No wheezing, rhonchi or rales.  Musculoskeletal:     Cervical back: Neck supple.     Right lower leg: No edema.     Left lower leg: No edema.  Lymphadenopathy:     Cervical: No cervical adenopathy.  Skin:    General: Skin is warm and dry.     Findings: No rash.  Neurological:     Mental Status: She is alert and oriented to person, place, and time. Mental status is at baseline.  Psychiatric:        Mood and Affect: Mood normal.        Behavior: Behavior normal.        Thought Content: Thought content normal.        Judgment: Judgment normal.      No results found for any visits on 12/28/21.  Assessment & Plan     1. Essential hypertension Chronic and unstable Pt was out of BP medication and restarted medication last week. Today her BP was 148/99. Recommended to continue with her current  medication: Metoprolol 50 mg daily Advised to measure her BP at home and bring her records with her to the next appt Continue low-salt diet and meditation techniques for relaxation. Will reassess in December  2. Spasmodic torticollis Chronic and stable problem? In the past, she has been trying a few different modes of treatment: PT and flexeril - cyclobenzaprine (FLEXERIL) 10 MG tablet; Take 1 tablet (10 mg total) by mouth at bedtime.  Dispense: 30 tablet; Refill: 1 Continue with flexeril but advised to check if pt could tolerate '5mg'$   instead of '10mg'$ . Pt was advised to continue with her exercise routine at home Will reassess in December  3. Anxiety Chronic and stable. Pt tried a different medications in the past. However, no other medications except xanax were helpful. Advised to start a trial of hydroxyzine at bedtime '25mg'$  up to '50mg'$ . And decrease a frequency of taking xanax at the same time - ALPRAZolam (XANAX) 0.25 MG tablet; Take 1 tablet (0.25 mg total) by mouth 2 (two) times daily as needed. for anxiety  Dispense: 60 tablet; Refill: 1 - hydrOXYzine (VISTARIL) 25 MG capsule; Take 1 capsule (25 mg total) by mouth 3 (three) times daily.  Dispense: 90 capsule; Refill: 0 We will reassess.  4. Overweight BMI 25.82 Healthy diet and daily exercise encouraged. Will reassess in December  5. Goiter? Screening for thyroid disease/ Positive family hx for thyroid disease. Pt would like to check her thyroid function as her family/sister, father has been suffering from thyroid problems. On PE , she has an enlarged thyroid gland Pt was advised to check with her insurance if US thyroid will be approved. Last TSH was 3.140 5 years ago Needs to update TSH At her next visit  FU in December.    The patient was advised to call back or seek an in-person evaluation if the symptoms worsen or if the condition fails to improve as anticipated.  I discussed the assessment and treatment plan with the  patient. The patient was provided an opportunity to ask questions and all were answered. The patient agreed with the plan and demonstrated an understanding of the instructions.  The entirety of the information documented in the History of Present Illness, Review of Systems and Physical Exam were personally obtained by me. Portions of this information were initially documented by the CMA and reviewed by me for thoroughness and accuracy.    Mardene Speak, [facility], [place] [phone] (phone) [phone] (fax)

## 2022-01-24 NOTE — Progress Notes (Unsigned)
I,Sha'taria Rene Sizelove,acting as a Education administrator for [employer], PA-C.,have documented all relevant documentation on the behalf of Mardene Speak, PA-C,as directed by  [employer], PA-C while in the presence of [employer], PA-C.   Established patient visit   Patient: Catherine Herrera   DOB: 04-17-1965   56 y.o. Female  MRN: [id number] Visit Date: 01/25/2022  Today's healthcare provider: Mardene Speak, PA-C   No chief complaint on file.  Subjective    HPI  Hypertension, follow-up  BP Readings from Last 3 Encounters:  12/28/21 (!) 148/99  04/06/21 (!) 145/90  12/10/19 (!) 145/84   Wt Readings from Last 3 Encounters:  12/28/21 160 lb (72.6 kg)  04/06/21 157 lb (71.2 kg)  12/10/19 161 lb (73 kg)     She was last seen for hypertension 4 weeks ago.  BP at that visit was 148/99. Management since that visit includes continue current treatment and monitor at home.  Outside blood pressures are {116/75;122/80; 122/78;142/80;145/87;120/80}.  Symptoms: No chest pain No chest pressure  Yes palpitations No syncope  No dyspnea No orthopnea  No paroxysmal nocturnal dyspnea No lower extremity edema   Pertinent labs Lab Results  Component Value Date   CHOL 222 (A) 08/08/2012   HDL 74 (A) 08/08/2012   LDLCALC 130 08/08/2012   TRIG 89 08/08/2012   Lab Results  Component Value Date   NA 140 01/12/2016   K 4.0 01/12/2016   CREATININE 1.17 (H) 01/12/2016   GFRNONAA 54 (L) 01/12/2016   GLUCOSE 81 01/12/2016   TSH 3.140 01/12/2016     The ASCVD Risk score (Arnett DK, et al., 2019) failed to calculate for the following reasons:   Cannot find a previous HDL lab   Cannot find a previous total cholesterol lab  --------------------------------------------------------------------------------------------------- Anxiety, Follow-up  She was last seen for anxiety 4 weeks ago. Changes made at last visit include start a trial of hydroxyzine at bedtime '25mg'$  up to '50mg'$ . And decrease a  frequency of taking xanax at the same time.   She reports excellent compliance with treatment. She reports fair tolerance of treatment. She is not having side effects. {document side effects if present:1}  She feels her anxiety is moderate and Improved since last visit.  Symptoms: No chest pain No difficulty concentrating  No dizziness Yes fatigue  No feelings of losing control Yes insomnia  No irritable Yes palpitations  No panic attacks No racing thoughts  No shortness of breath No sweating  No tremors/shakes    GAD-7 Results    12/28/2021    3:39 PM 12/10/2019    3:47 PM  GAD-7 Generalized Anxiety Disorder Screening Tool  1. Feeling Nervous, Anxious, or on Edge 3 1  2. Not Being Able to Stop or Control Worrying 2 0  3. Worrying Too Much About Different Things 2 0  4. Trouble Relaxing 1 1  5. Being So Restless it's Hard To Sit Still 0 0  6. Becoming Easily Annoyed or Irritable 1 0  7. Feeling Afraid As If Something Awful Might Happen 0 0  Total GAD-7 Score 9 2  Difficulty At Work, Home, or Getting  Along With Others? Not difficult at all Not difficult at all    PHQ-9 Scores    12/28/2021    3:38 PM 04/06/2021    1:55 PM 12/10/2019    3:46 PM  PHQ9 SCORE ONLY  PHQ-9 Total Score '2 2 2    '$ ---------------------------------------------------------------------------------------------------   Medications: Outpatient Medications Prior to  Visit  Medication Sig   ALPRAZolam (XANAX) 0.25 MG tablet Take 1 tablet (0.25 mg total) by mouth 2 (two) times daily as needed. for anxiety   cyclobenzaprine (FLEXERIL) 10 MG tablet Take 1 tablet (10 mg total) by mouth at bedtime.   hydrOXYzine (VISTARIL) 25 MG capsule Take 1 capsule (25 mg total) by mouth 3 (three) times daily.   metoprolol succinate (TOPROL-XL) 50 MG 24 hr tablet TAKE 1 TABLET BY MOUTH ONCE DAILY TAKE  WITH  OR  IMMEDIATELY  FOLLOWING  A  MEAL   No facility-administered medications prior to visit.    Review of  Systems  {Labs  Heme  Chem  Endocrine  Serology  Results Review (optional):23779}   Objective    LMP 12/06/2016  {Show previous vital signs (optional):23777}  Physical Exam  ***  No results found for any visits on 01/25/22.  Assessment & Plan     ***  No follow-ups on file.      {provider attestation***:1}   Mardene Speak, Hershal Coria  [facility] [phone] (phone) [phone] (fax)  [place]

## 2022-01-25 ENCOUNTER — Ambulatory Visit (INDEPENDENT_AMBULATORY_CARE_PROVIDER_SITE_OTHER): Payer: BC Managed Care – PPO | Admitting: Physician Assistant

## 2022-01-25 ENCOUNTER — Encounter: Payer: Self-pay | Admitting: Physician Assistant

## 2022-01-25 VITALS — BP 149/89 | HR 100 | Ht 66.0 in | Wt 161.1 lb

## 2022-01-25 DIAGNOSIS — F419 Anxiety disorder, unspecified: Secondary | ICD-10-CM | POA: Diagnosis not present

## 2022-01-25 DIAGNOSIS — G243 Spasmodic torticollis: Secondary | ICD-10-CM

## 2022-01-25 DIAGNOSIS — I10 Essential (primary) hypertension: Secondary | ICD-10-CM | POA: Diagnosis not present

## 2022-01-25 DIAGNOSIS — E049 Nontoxic goiter, unspecified: Secondary | ICD-10-CM | POA: Diagnosis not present

## 2022-01-25 MED ORDER — CYCLOBENZAPRINE HCL 10 MG PO TABS: 10.0000 mg | ORAL_TABLET | Freq: Every day | ORAL | 1 refills | Status: DC

## 2022-01-25 NOTE — Progress Notes (Unsigned)
Established Patient Office Visit  Subjective   Patient ID: Catherine Herrera, female    DOB: 1965-04-11  Age: 56 y.o. MRN: [id number]  No chief complaint on file.   Hypertension This is a chronic problem. The problem has been waxing and waning since onset. Associated symptoms include anxiety, malaise/fatigue and neck pain. Pertinent negatives include no blurred vision, chest pain, headaches, orthopnea, palpitations, peripheral edema, PND, shortness of breath or sweats. Risk factors for coronary artery disease include dyslipidemia, stress and post-menopausal state. The current treatment provides moderate improvement. There are no compliance problems.  Identifiable causes of hypertension include a thyroid problem.  Neck Pain  This is a chronic problem. The problem occurs daily. The problem has been unchanged. The pain is present in the right side. Pertinent negatives include no chest pain or headaches. She has tried home exercises, muscle relaxants and heat for the symptoms. The treatment provided moderate relief.  Thyroid Problem Presents for follow-up visit. Symptoms include anxiety, cold intolerance, constipation, fatigue and tremors. Patient reports no hoarse voice or palpitations.   Essential hypertension   {History (Optional):23778}  Review of Systems  Constitutional:  Positive for fatigue and malaise/fatigue.  HENT:  Negative for hoarse voice.   Eyes:  Negative for blurred vision.  Respiratory:  Negative for shortness of breath.   Cardiovascular:  Negative for chest pain, palpitations, orthopnea and PND.  Gastrointestinal:  Positive for constipation.  Musculoskeletal:  Positive for neck pain.  Neurological:  Positive for tremors. Negative for headaches.  Endo/Heme/Allergies:  Positive for cold intolerance.  Psychiatric/Behavioral:  The patient is nervous/anxious.   All other systems reviewed and are negative.  Except See hpi   Objective:     BP (!) 149/89 (BP Location:  Right Arm, Patient Position: Sitting, Cuff Size: Normal)   Pulse 100   Ht '5\' 6"'$  (1.676 m)   Wt 161 lb 1.6 oz (73.1 kg)   LMP 12/06/2016   SpO2 100%   BMI 26.00 kg/m  {Vitals History (Optional):23777}  Physical Exam Vitals reviewed.  Constitutional:      General: She is not in acute distress.    Appearance: Normal appearance. She is well-developed. She is not diaphoretic.  HENT:     Head: Normocephalic and atraumatic.  Eyes:     General: No scleral icterus.    Conjunctiva/sclera: Conjunctivae normal.  Neck:     Thyroid: No thyromegaly.     Comments: Slight tremor of head? Enlarged thyroid gland Cardiovascular:     Rate and Rhythm: Normal rate and regular rhythm.     Pulses: Normal pulses.     Heart sounds: Normal heart sounds. No murmur heard. Pulmonary:     Effort: Pulmonary effort is normal. No respiratory distress.     Breath sounds: Normal breath sounds. No wheezing, rhonchi or rales.  Musculoskeletal:     Cervical back: Neck supple.     Right lower leg: No edema.     Left lower leg: No edema.  Lymphadenopathy:     Cervical: No cervical adenopathy.  Skin:    General: Skin is warm and dry.     Findings: No rash.  Neurological:     Mental Status: She is alert and oriented to person, place, and time. Mental status is at baseline.  Psychiatric:        Mood and Affect: Mood normal.        Behavior: Behavior normal.      No results found for any visits on 01/25/22.  {Labs (  Optional):23779}  The ASCVD Risk score (Arnett DK, et al., 2019) failed to calculate for the following reasons:   Cannot find a previous HDL lab   Cannot find a previous total cholesterol lab    Assessment & Plan:   Problem List Items Addressed This Visit       Nervous and Auditory   Spasmodic torticollis   Relevant Medications   cyclobenzaprine (FLEXERIL) 10 MG tablet     Other   Anxiety   Other Visit Diagnoses     Essential hypertension    -  Primary   Goiter       Relevant  Orders   TSH   US THYROID      1. Essential hypertension Chronic and unstable. BP today was 149/89 but BP at home was at goal. Continue metoprolol '50mg'$  daily but might need to adjust if BP at home will be higher than 140/90 Continue to measure her BP at home Continue low-salt diet and exercise as tolerated Will FU  2. Spasmodic torticollis Chronic and stable Tremor on PE Pt was advised to continue with flexeril  - cyclobenzaprine (FLEXERIL) 10 MG tablet; Take 1 tablet (10 mg total) by mouth at bedtime.  Dispense: 30 tablet; Refill: 1 Continue her stretching exercise at home Will reassess 3. Anxiety Chronic and stable Continue her current medication Pt has her insurance via her spouse's insurance Next year when he will retire, [facility] Will FU  4. Goiter Symptomatic? Unclear if symptoms related to Goiter or anxiety Per insurance, she might proceed with US thyroid. Has a positive family hx for thyroid disorders. - TSH - US THYROID; Future Will Fu after receiving the results  FU PRN The patient was advised to call back or seek an in-person evaluation if the symptoms worsen or if the condition fails to improve as anticipated.  I discussed the assessment and treatment plan with the patient. The patient was provided an opportunity to ask questions and all were answered. The patient agreed with the plan and demonstrated an understanding of the instructions.  The entirety of the information documented in the History of Present Illness, Review of Systems and Physical Exam were personally obtained by me. Portions of this information were initially documented by the CMA and reviewed by me for thoroughness and accuracy.  Mardene Speak, [facility], [place] [phone] (phone) [phone] (fax)

## 2022-01-26 ENCOUNTER — Other Ambulatory Visit: Payer: Self-pay

## 2022-01-26 DIAGNOSIS — I10 Essential (primary) hypertension: Secondary | ICD-10-CM

## 2022-01-26 LAB — TSH: TSH: 2.85 u[IU]/mL (ref 0.450–4.500)

## 2022-01-26 MED ORDER — METOPROLOL SUCCINATE ER 50 MG PO TB24: ORAL_TABLET | ORAL | 1 refills | Status: DC

## 2022-01-31 NOTE — Progress Notes (Signed)
Please, let pt know that her TSH WNL, her TPO WNL, next step is US thyroid. Order was placed. Please, let me know if you have a problem to schedule an US thyroid.

## 2022-02-02 ENCOUNTER — Telehealth: Payer: Self-pay

## 2022-02-02 NOTE — Telephone Encounter (Signed)
Pt given lab results per notes of Janna, [place] on 02/02/22. Pt verbalized understanding was provided with [facility] to schedule [place] since pt had missed VM but thought gave wrong # to call back.

## 2022-02-03 LAB — SPECIMEN STATUS REPORT

## 2022-02-03 LAB — THYROID PEROXIDASE ANTIBODY: Thyroperoxidase Ab SerPl-aCnc: <9 IU/mL (ref 0–34)

## 2022-02-09 ENCOUNTER — Ambulatory Visit
Admission: RE | Admit: 2022-02-09 | Discharge: 2022-02-09 | Disposition: A | Discharge status: Home / Self Care | Payer: BC Managed Care – PPO | Source: Ambulatory Visit | Attending: Physician Assistant | Admitting: Physician Assistant

## 2022-02-09 DIAGNOSIS — E049 Nontoxic goiter, unspecified: Secondary | ICD-10-CM | POA: Diagnosis present

## 2022-02-11 NOTE — Progress Notes (Signed)
Please let pt know that her US thyroid came back normal for her age. With both TPO and TSH WNL, this is reassuring.

## 2022-02-21 NOTE — Progress Notes (Signed)
Please, let pt know that I will send a referral for international radiology if it will be okay with her

## 2022-04-18 ENCOUNTER — Ambulatory Visit: Payer: BC Managed Care – PPO | Admitting: Physician Assistant

## 2022-04-18 ENCOUNTER — Encounter: Payer: Self-pay | Admitting: Physician Assistant

## 2022-04-18 ENCOUNTER — Ambulatory Visit: Payer: Self-pay | Admitting: *Deleted

## 2022-04-18 VITALS — BP 134/95 | HR 99 | Wt 156.6 lb

## 2022-04-18 DIAGNOSIS — F419 Anxiety disorder, unspecified: Secondary | ICD-10-CM | POA: Diagnosis not present

## 2022-04-18 DIAGNOSIS — R220 Localized swelling, mass and lump, head: Secondary | ICD-10-CM

## 2022-04-18 MED ORDER — PREDNISONE 20 MG PO TABS: 20.0000 mg | ORAL_TABLET | Freq: Every day | ORAL | 0 refills | Status: DC

## 2022-04-18 MED ORDER — HYDROXYZINE PAMOATE 50 MG PO CAPS: ORAL_CAPSULE | ORAL | 0 refills | Status: DC

## 2022-04-18 NOTE — Telephone Encounter (Signed)
  Chief Complaint: lip swelling  Symptoms: sinus issues nasal drainage x 2 weeks. Lip swelling bottom lip started 2 am this morning and now top lip swelling . No pain, itching.  Frequency: 2 am today  Pertinent Negatives: Patient denies chest pain no difficulty breathing no tongue swelling or swelling of throat Disposition: '[]'$ ED /'[]'$ Urgent Care (no appt availability in office) / '[x]'$ Appointment(In office/virtual)/ '[]'$  [place] Virtual [facility]/ '[]'$ Home Care/ '[]'$ Refused Recommended Disposition /'[]'$  Mobile Bus/ '[]'$  Follow-up with PCP Additional Notes:  Appt scheduled today       Reason for Disposition  [1] Mild lip swelling from food reaction AND [2] diagnosis never confirmed by a doctor (or NP/PA)  Answer Assessment - Initial Assessment Questions 1. ONSET: "When did the swelling start?" (e.g., minutes, hours, days)     Middle of night 2 am  has had sx sinus issues x 2 weeks  2. SEVERITY: "How swollen is it?"     Swollen more than normal size of lips  3. ITCHING: "Is there any itching?" If Yes, ask: "How much?"   (Scale 1-10; mild, moderate or severe)     Some itching  4. PAIN: "Is the swelling painful to touch?" If Yes, ask: "How painful is it?"   (Scale 1-10; mild, moderate or severe)     Discomfort not pain  5. : "What do you think is causing the lip swelling?"     Not sure  6. RECURRENT SYMPTOM: "Have you had lip swelling before?" If Yes, ask: "When was the last time?" "What happened that time?"     na 7. OTHER SYMPTOMS: "Do you have any other symptoms?" (e.g., toothache)     Sinus issues nasal drainage  woke up with bottom lip swelling now top lip has started swelling  8. PREGNANCY: "Is there any chance you are pregnant?" "When was your last menstrual period?"     na  Protocols used: Lip Swelling-A-AH

## 2022-04-18 NOTE — Progress Notes (Unsigned)
     I,Sha'taria Tyson,acting as a Education administrator for [employer], PA-C.,have documented all relevant documentation on the behalf of Mardene Speak, PA-C,as directed by  [employer], PA-C while in the presence of [employer], PA-C.   Established patient visit   Patient: Catherine Herrera   DOB: 28-Nov-1965   56 y.o. Female  MRN: [id number] Visit Date: 04/18/2022  Today's healthcare provider: Mardene Speak, PA-C   No chief complaint on file.  Subjective    HPI  Edema: Patient complains of edema. The location of the edema is lips.  The edema has been localized.  Onset of symptoms was a few hours ago, stable since that time. The edema is present upon awakening. The patient states never.  The swelling has been aggravated by nothing, relieved by nothing, and been associated with nothing.   Medications: Outpatient Medications Prior to Visit  Medication Sig   ALPRAZolam (XANAX) 0.25 MG tablet Take 1 tablet (0.25 mg total) by mouth 2 (two) times daily as needed. for anxiety   cyclobenzaprine (FLEXERIL) 10 MG tablet Take 1 tablet (10 mg total) by mouth at bedtime.   hydrOXYzine (VISTARIL) 25 MG capsule Take 1 capsule (25 mg total) by mouth 3 (three) times daily.   metoprolol succinate (TOPROL-XL) 50 MG 24 hr tablet TAKE 1 TABLET BY MOUTH ONCE DAILY TAKE  WITH  OR  IMMEDIATELY  FOLLOWING  A  MEAL   No facility-administered medications prior to visit.    Review of Systems  {Labs  Heme  Chem  Endocrine  Serology  Results Review (optional):23779}   Objective    LMP 12/06/2016  {Show previous vital signs (optional):23777}  Physical Exam  ***  No results found for any visits on 04/18/22.  Assessment & Plan     ***  No follow-ups on file.      {provider attestation***:1}   Mardene Speak, PA-C  [place] [phone] (phone) [phone] (fax)  [place]

## 2022-04-20 DIAGNOSIS — R220 Localized swelling, mass and lump, head: Secondary | ICD-10-CM | POA: Insufficient documentation

## 2022-05-02 ENCOUNTER — Other Ambulatory Visit: Payer: Self-pay | Admitting: Physician Assistant

## 2022-05-02 ENCOUNTER — Other Ambulatory Visit: Payer: Self-pay

## 2022-05-02 ENCOUNTER — Ambulatory Visit: Payer: Self-pay | Admitting: *Deleted

## 2022-05-02 DIAGNOSIS — J329 Chronic sinusitis, unspecified: Secondary | ICD-10-CM

## 2022-05-02 DIAGNOSIS — I10 Essential (primary) hypertension: Secondary | ICD-10-CM

## 2022-05-02 MED ORDER — METOPROLOL SUCCINATE ER 50 MG PO TB24: ORAL_TABLET | ORAL | 1 refills | Status: DC

## 2022-05-02 MED ORDER — AMOXICILLIN-POT CLAVULANATE 875-125 MG PO TABS: 1.0000 | ORAL_TABLET | Freq: Two times a day (BID) | ORAL | 0 refills | Status: DC

## 2022-05-02 MED ORDER — METOPROLOL SUCCINATE ER 50 MG PO TB24: ORAL_TABLET | ORAL | 0 refills | Status: DC

## 2022-05-02 NOTE — Telephone Encounter (Signed)
Requested Prescriptions  Pending Prescriptions Disp Refills   metoprolol succinate (TOPROL-XL) 50 MG 24 hr tablet 90 tablet 1    Sig: TAKE 1 TABLET BY MOUTH ONCE DAILY TAKE  WITH  OR  IMMEDIATELY  FOLLOWING  A  MEAL     Cardiovascular:  Beta Blockers Failed - 05/02/2022  1:22 PM      Failed - Last BP in normal range    BP Readings from Last 1 Encounters:  04/18/22 (!) 134/95         Passed - Last Heart Rate in normal range    Pulse Readings from Last 1 Encounters:  04/18/22 99         Passed - Valid encounter within last 6 months    Recent Outpatient Visits           2 weeks ago Lip swelling   [place], [place], PA-C   3 months ago Essential hypertension   [facility] [place], [place], PA-C   4 months ago Essential hypertension   [place], [place], PA-C   1 year ago Essential hypertension   [place], [place], [place]   2 years ago Essential hypertension   [place] Eulas [place], MD

## 2022-05-02 NOTE — Telephone Encounter (Signed)
Requested Prescriptions  Pending Prescriptions Disp Refills   metoprolol succinate (TOPROL-XL) 50 MG 24 hr tablet 15 tablet 0    Sig: TAKE 1 TABLET BY MOUTH ONCE DAILY TAKE  WITH  OR  IMMEDIATELY  FOLLOWING  A  MEAL     Cardiovascular:  Beta Blockers Failed - 05/02/2022 12:32 PM      Failed - Last BP in normal range    BP Readings from Last 1 Encounters:  04/18/22 (!) 134/95         Passed - Last Heart Rate in normal range    Pulse Readings from Last 1 Encounters:  04/18/22 99         Passed - Valid encounter within last 6 months    Recent Outpatient Visits           2 weeks ago Lip swelling   [place], [place], PA-C   3 months ago Essential hypertension   [place], [place], PA-C   4 months ago Essential hypertension   [place], [place], PA-C   1 year ago Essential hypertension   [place], [place], [place]   2 years ago Essential hypertension   [place] Eulas Post, MD

## 2022-05-02 NOTE — Telephone Encounter (Signed)
Patient needs 2 week supply sent to [facility] in [facility] and [facility]

## 2022-05-02 NOTE — Telephone Encounter (Signed)
Message from [place] sent at 05/02/2022  9:11 AM EDT  Summary: Pt requests Rx for amoxicillin   Pt requests Rx for amoxicillin because she stated that the prednisone is not working. Cb# [id number]          Call History   Type Contact Phone/Fax User  05/02/2022 09:09 AM EDT Phone (Incoming) Jarome Lamas "Beth" (Self) [phone] Jerilynn Mages) Leonides Schanz, [place]

## 2022-05-02 NOTE — Telephone Encounter (Signed)
Medication Refill - Medication: metoprolol succinate (TOPROL-XL) 50 MG 24 hr tablet   Has the patient contacted their pharmacy? No. Pt stated her [facility] but she has not heard from them about her meds  Preferred Pharmacy (with phone number or street name):  [place], [place] Phone: [phone]  Fax: [phone]     Has the patient been seen for an appointment in the last year OR does the patient have an upcoming appointment? Yes.    Agent: Please be advised that RX refills may take up to 3 business days. We ask that you follow-up with your pharmacy.

## 2022-05-02 NOTE — Telephone Encounter (Signed)
Attempted to return her call.   Left a voicemail to call back to discuss symptoms with a nurse. 

## 2022-05-02 NOTE — Progress Notes (Unsigned)
Med sent to pharmacy for 5-10 days. If she will get better after 5 days, she needs to stop taking abx

## 2022-05-02 NOTE — Telephone Encounter (Signed)
  Chief Complaint: Sinus infection Symptoms: sinus pain pressure  Frequency: 1 month Pertinent Negatives: Patient denies fever Disposition: [] ED /[] Urgent Care (no appt availability in office) / [] Appointment(In office/virtual)/ []  [place] Virtual [facility]/ [] Home Care/ [x] Refused Recommended Disposition /[] [place] Mobile Bus/ []  Follow-up with PCP Additional Notes: Pt was seen about 1 month ago for facial swelling. At that time pt believes that she had a sinus infection that she still has. Pt would like an antibiotic called in. Pt would rather not come into the office.    Summary: Pt requests Rx for amoxicillin   Pt requests Rx for amoxicillin because she stated that the prednisone is not working. Cb# [phone]     Reason for Disposition  [1] Sinus congestion (pressure, fullness) AND [2] present > 10 days  Answer Assessment - Initial Assessment Questions 1. LOCATION: "Where does it hurt?"      HA and Sinus pressure 2. ONSET: "When did the sinus pain start?"  (e.g., hours, days)      1 month 3. SEVERITY: "How bad is the pain?"   (Scale 1-10; mild, moderate or severe)   - MILD (1-3): doesn't interfere with normal activities    - MODERATE (4-7): interferes with normal activities (e.g., work or school) or awakens from sleep   - SEVERE (8-10): excruciating pain and patient unable to do any normal activities        Mild moderate. 4. RECURRENT SYMPTOM: "Have you ever had sinus problems before?" If Yes, ask: "When was the last time?" and "What happened that time?"      yes 5. NASAL CONGESTION: "Is the nose blocked?" If Yes, ask: "Can you open it or must you breathe through your mouth?"     yes 6. NASAL DISCHARGE: "Do you have discharge from your nose?" If so ask, "What color?"     yellow 7. FEVER: "Do you have a fever?" If Yes, ask: "What is it, how was it measured, and when did it start?"      No 8. OTHER SYMPTOMS: "Do you have any other symptoms?" (e.g., sore throat, cough,  earache, difficulty breathing)     Cough,  Sinus congestion  Protocols used: Sinus Pain or Congestion-A-AH

## 2022-05-03 NOTE — Progress Notes (Signed)
Unable to LVM--not set up. 

## 2022-05-24 ENCOUNTER — Other Ambulatory Visit: Payer: Self-pay | Admitting: Physician Assistant

## 2022-05-24 DIAGNOSIS — G243 Spasmodic torticollis: Secondary | ICD-10-CM

## 2022-05-24 DIAGNOSIS — R220 Localized swelling, mass and lump, head: Secondary | ICD-10-CM

## 2022-05-24 DIAGNOSIS — F419 Anxiety disorder, unspecified: Secondary | ICD-10-CM

## 2022-05-24 NOTE — Telephone Encounter (Signed)
Medication Refill - Medication: ALPRAZolam (XANAX) 0.25 MG tablet , cyclobenzaprine (FLEXERIL) 10 MG tablet , hydrOXYzine (VISTARIL) 50 MG capsule ,   Has the patient contacted their pharmacy? Yes.     Preferred Pharmacy (with phone number or street name):  [facility] [place] - [address] Phone: [phone]  Fax: [phone]     Has the patient been seen for an appointment in the last year OR does the patient have an upcoming appointment? Yes.    Please assist patient further

## 2022-05-25 NOTE — Telephone Encounter (Signed)
Requested medication (s) are due for refill today: yes  Requested medication (s) are on the active medication list: yes  Last refill:  multiple dates, due for refill  Future visit scheduled: no  Notes to clinic:  Unable to refill per protocol, cannot delegate.      Requested Prescriptions  Pending Prescriptions Disp Refills   ALPRAZolam (XANAX) 0.25 MG tablet 60 tablet 1    Sig: Take 1 tablet (0.25 mg total) by mouth 2 (two) times daily as needed. for anxiety     Not Delegated - Psychiatry: Anxiolytics/Hypnotics 2 Failed - 05/24/2022  8:54 AM      Failed - This refill cannot be delegated      Failed - Urine Drug Screen completed in last 360 days      Passed - Patient is not pregnant      Passed - Valid encounter within last 6 months    Recent Outpatient Visits           1 month ago Lip swelling   [facility] [place], [place], PA-C   4 months ago Essential hypertension   [place] [facility] [place], [place], PA-C   4 months ago Essential hypertension   [place] [facility] [place], [place], PA-C   1 year ago Essential hypertension   [place] [facility] [place], [place], PA-C   2 years ago Essential hypertension   [place] [facility] Bosie Clos, MD               cyclobenzaprine (FLEXERIL) 10 MG tablet 30 tablet 1    Sig: Take 1 tablet (10 mg total) by mouth at bedtime.     Not Delegated - Analgesics:  Muscle Relaxants Failed - 05/24/2022  8:54 AM      Failed - This refill cannot be delegated      Passed - Valid encounter within last 6 months    Recent Outpatient Visits           1 month ago Lip swelling   [place] [facility] [place], [place], PA-C   4 months ago Essential hypertension   [place] [facility] [place], [place], PA-C   4 months ago Essential hypertension   [place] [facility] [place], [place], PA-C    1 year ago Essential hypertension   [facility] [place], [place], [place]   2 years ago Essential hypertension   [place] [facility] Bosie Clos, MD               hydrOXYzine (VISTARIL) 50 MG capsule 30 capsule 0    Sig: Start taking 1 tab at bedtime and add second tab AM if symptoms will not be controlled.     Ear, Nose, and Throat:  Antihistamines 2 Failed - 05/24/2022  8:54 AM      Failed - Cr in normal range and within 360 days    Creatinine, Ser  Date Value Ref Range Status  01/12/2016 1.17 (H) 0.57 - 1.00 mg/dL Final         Passed - Valid encounter within last 12 months    Recent Outpatient Visits           1 month ago Lip swelling   [place] [facility] [place], [place], PA-C   4 months ago Essential hypertension   [place] [facility] [place], [place], [place]   4 months ago Essential hypertension  [facility] [place], [place], [place]   1 year ago Essential hypertension   [place] [facility] [place], [place], [place]   2 years ago Essential hypertension   [facility] Bosie Clos, MD

## 2022-05-26 MED ORDER — HYDROXYZINE PAMOATE 50 MG PO CAPS
ORAL_CAPSULE | ORAL | 0 refills | Status: DC
Start: 2022-05-26 — End: 2023-10-19

## 2022-05-26 MED ORDER — CYCLOBENZAPRINE HCL 10 MG PO TABS
10.0000 mg | ORAL_TABLET | Freq: Every day | ORAL | 0 refills | Status: DC
Start: 2022-05-26 — End: 2023-10-19

## 2022-05-26 MED ORDER — ALPRAZOLAM 0.25 MG PO TABS
0.2500 mg | ORAL_TABLET | Freq: Two times a day (BID) | ORAL | 1 refills | Status: DC | PRN
Start: 2022-05-26 — End: 2022-10-06

## 2022-10-06 ENCOUNTER — Other Ambulatory Visit: Payer: Self-pay | Admitting: Physician Assistant

## 2022-10-06 DIAGNOSIS — R220 Localized swelling, mass and lump, head: Secondary | ICD-10-CM

## 2022-10-06 DIAGNOSIS — F419 Anxiety disorder, unspecified: Secondary | ICD-10-CM

## 2022-10-06 DIAGNOSIS — G243 Spasmodic torticollis: Secondary | ICD-10-CM

## 2022-10-06 DIAGNOSIS — I10 Essential (primary) hypertension: Secondary | ICD-10-CM

## 2022-10-06 NOTE — Telephone Encounter (Signed)
Requested medication (s) are due for refill today: yes  Requested medication (s) are on the active medication list: yes    Last refill: Hydroxyzine  05/26/22   #90  0 refills       Xanax  05/26/22  #60  1 refill       Flexeril  05/26/22  #90  0 refills  Future visit scheduled no  Notes to clinic: Xanax and Flexeril non-delegated    Hydroxyzine failed due to labs. Please review. Thank you  Requested Prescriptions  Pending Prescriptions Disp Refills   ALPRAZolam (XANAX) 0.25 MG tablet 60 tablet 1    Sig: Take 1 tablet (0.25 mg total) by mouth 2 (two) times daily as needed. for anxiety     Not Delegated - Psychiatry: Anxiolytics/Hypnotics 2 Failed - 10/06/2022  8:28 AM      Failed - This refill cannot be delegated      Failed - Urine Drug Screen completed in last 360 days      Failed - Valid encounter within last 6 months    Recent Outpatient Visits           5 months ago Lip swelling   [facility], [place], PA-C   8 months ago Essential hypertension   [facility] [place], [place], PA-C   9 months ago Essential hypertension   [place] [facility] [place], [place], PA-C   1 year ago Essential hypertension   [facility] [place], [place], [place]   2 years ago Essential hypertension   [facility] Bosie Clos, MD              Passed - Patient is not pregnant       hydrOXYzine (VISTARIL) 50 MG capsule 90 capsule 0    Sig: Start taking 1 tab at bedtime and add second tab AM if symptoms will not be controlled.     Ear, Nose, and Throat:  Antihistamines 2 Failed - 10/06/2022  8:28 AM      Failed - Cr in normal range and within 360 days    Creatinine, Ser  Date Value Ref Range Status  01/12/2016 1.17 (H) 0.57 - 1.00 mg/dL Final         Passed - Valid encounter within last 12 months    Recent Outpatient Visits           5 months ago Lip swelling    [place] [facility] [place], [place], PA-C   8 months ago Essential hypertension   [place] [facility] [place], [place], PA-C   9 months ago Essential hypertension   [place] [facility] [place], [place], PA-C   1 year ago Essential hypertension   [place] [facility] [place], [place], PA-C   2 years ago Essential hypertension   [place] [facility] Bosie Clos, MD               cyclobenzaprine (FLEXERIL) 10 MG tablet 90 tablet 0    Sig: Take 1 tablet (10 mg total) by mouth at bedtime.     Not Delegated - Analgesics:  Muscle Relaxants Failed - 10/06/2022  8:28 AM      Failed - This refill cannot be delegated      Failed - Valid encounter within last 6 months    Recent Outpatient Visits           5 months  ago Lip swelling   [place] [facility] [place], [place], [place]   8 months ago Essential hypertension   [place] [facility] [place], [place], [place]   9 months ago Essential hypertension   [place] [facility] [place], [place], [place]   1 year ago Essential hypertension   [place] [place], [place], [place]   2 years ago Essential hypertension   [place] [facility] Bosie Clos, MD              Refused Prescriptions Disp Refills   metoprolol succinate (TOPROL-XL) 50 MG 24 hr tablet 90 tablet 1    Sig: TAKE 1 TABLET BY MOUTH ONCE DAILY TAKE  WITH  OR  IMMEDIATELY  FOLLOWING  A  MEAL     Cardiovascular:  Beta Blockers Failed - 10/06/2022  8:28 AM      Failed - Last BP in normal range    BP Readings from Last 1 Encounters:  04/18/22 (!) 134/95         Failed - Valid encounter within last 6 months    Recent Outpatient Visits           5 months ago Lip swelling   [place] [place], [place], PA-C   8 months ago Essential hypertension   [place]  [facility] [place], [place], PA-C   9 months ago Essential hypertension   [place] [facility] [place], [place], PA-C   1 year ago Essential hypertension   [place] [facility] [place], [place], [place]   2 years ago Essential hypertension   [place] [facility] Bosie Clos, MD              Passed - Last Heart Rate in normal range    Pulse Readings from Last 1 Encounters:  04/18/22 99

## 2022-10-06 NOTE — Telephone Encounter (Signed)
Medication Refill - Medication:  ALPRAZolam (XANAX) 0.25 MG tablet  metoprolol succinate (TOPROL-XL) 50 MG 24 hr tablet  cyclobenzaprine (FLEXERIL) 10 MG tablet   hydrOXYzine (VISTARIL) 50 MG capsule (stated the generic name was Pamoate?)  *almost out of all above & requesting 3 month supply written for a whole year    Has the patient contacted their pharmacy? Yes.  Advised to contact PCP   Preferred Pharmacy (with phone number or street name):   [facility] [place] - [address]  Phone: [phone] Fax: [phone]    Has the patient been seen for an appointment in the last year OR does the patient have an upcoming appointment? Yes.  Last seen 04/18/22

## 2022-10-06 NOTE — Telephone Encounter (Signed)
Requested Prescriptions  Pending Prescriptions Disp Refills   metoprolol succinate (TOPROL-XL) 50 MG 24 hr tablet 90 tablet 1    Sig: TAKE 1 TABLET BY MOUTH ONCE DAILY TAKE  WITH  OR  IMMEDIATELY  FOLLOWING  A  MEAL     Cardiovascular:  Beta Blockers Failed - 10/06/2022  8:28 AM      Failed - Last BP in normal range    BP Readings from Last 1 Encounters:  04/18/22 (!) 134/95         Failed - Valid encounter within last 6 months    Recent Outpatient Visits           5 months ago Lip swelling   [place] [facility] [place], [place], PA-C   8 months ago Essential hypertension   [place] [facility] [place], [place], PA-C   9 months ago Essential hypertension   [place] [facility] [place], [place], PA-C   1 year ago Essential hypertension   [place] [facility] [place], [place], [place]   2 years ago Essential hypertension   [place] Bosie Clos, MD              Passed - Last Heart Rate in normal range    Pulse Readings from Last 1 Encounters:  04/18/22 99          ALPRAZolam (XANAX) 0.25 MG tablet 60 tablet 1    Sig: Take 1 tablet (0.25 mg total) by mouth 2 (two) times daily as needed. for anxiety     Not Delegated - Psychiatry: Anxiolytics/Hypnotics 2 Failed - 10/06/2022  8:28 AM      Failed - This refill cannot be delegated      Failed - Urine Drug Screen completed in last 360 days      Failed - Valid encounter within last 6 months    Recent Outpatient Visits           5 months ago Lip swelling   [place] [facility] [place], [place], PA-C   8 months ago Essential hypertension   [facility] [place], [place], PA-C   9 months ago Essential hypertension   [place] [facility] [place], [place], PA-C   1 year ago Essential hypertension   [place] [facility] [place], [place], [place]   2 years ago  Essential hypertension   [place] Bosie Clos, MD              Passed - Patient is not pregnant       hydrOXYzine (VISTARIL) 50 MG capsule 90 capsule 0    Sig: Start taking 1 tab at bedtime and add second tab AM if symptoms will not be controlled.     Ear, Nose, and Throat:  Antihistamines 2 Failed - 10/06/2022  8:28 AM      Failed - Cr in normal range and within 360 days    Creatinine, Ser  Date Value Ref Range Status  01/12/2016 1.17 (H) 0.57 - 1.00 mg/dL Final         Passed - Valid encounter within last 12 months    Recent Outpatient Visits           5 months ago Lip swelling   [place] [facility] [place], [place], PA-C   8 months ago Essential hypertension    [place] [place], [place], [place]   9 months  ago Essential hypertension   [place] [facility] [place], [place], [place]   1 year ago Essential hypertension   [place] [facility] [place], [place], [place]   2 years ago Essential hypertension   [place] [facility] Bosie Clos, MD               cyclobenzaprine (FLEXERIL) 10 MG tablet 90 tablet 0    Sig: Take 1 tablet (10 mg total) by mouth at bedtime.     Not Delegated - Analgesics:  Muscle Relaxants Failed - 10/06/2022  8:28 AM      Failed - This refill cannot be delegated      Failed - Valid encounter within last 6 months    Recent Outpatient Visits           5 months ago Lip swelling   [place] [facility] [place], [place], PA-C   8 months ago Essential hypertension   [place] [facility] [place], [place], [place]   9 months ago Essential hypertension   [place] [facility] [place], [place], PA-C   1 year ago Essential hypertension   [place] [place], [place], [place]   2 years ago Essential hypertension    [facility]  Bosie Clos, MD

## 2022-10-11 MED ORDER — ALPRAZOLAM 0.25 MG PO TABS: 0.2500 mg | ORAL_TABLET | Freq: Two times a day (BID) | ORAL | 0 refills | Status: DC | PRN

## 2022-10-17 ENCOUNTER — Other Ambulatory Visit: Payer: Self-pay | Admitting: Physician Assistant

## 2022-10-17 DIAGNOSIS — I10 Essential (primary) hypertension: Secondary | ICD-10-CM

## 2022-10-19 NOTE — Telephone Encounter (Signed)
Requested Prescriptions  Pending Prescriptions Disp Refills   metoprolol succinate (TOPROL-XL) 50 MG 24 hr tablet [Pharmacy Med Name: METOPROLOL SUCCINATE ER TABS 50MG ] 90 tablet 0    Sig: TAKE 1 TABLET ONCE DAILY WITH OR IMMEDIATELY FOLLOWING A MEAL     Cardiovascular:  Beta Blockers Failed - 10/17/2022 12:57 AM      Failed - Last BP in normal range    BP Readings from Last 1 Encounters:  04/18/22 (!) 134/95         Failed - Valid encounter within last 6 months    Recent Outpatient Visits           6 months ago Lip swelling   [place] [facility] [place], [place], PA-C   8 months ago Essential hypertension   [place] [facility] [place], [place], PA-C   9 months ago Essential hypertension   [place] [facility] [place], [place], PA-C   1 year ago Essential hypertension   [place] [facility] [place], [place], [place]   2 years ago Essential hypertension    [facility] Bos[facility] Clos, MD              Passed - Last Heart Rate in normal range    Pulse Readings from Last 1 Encounters:  04/18/22 99

## 2023-01-17 ENCOUNTER — Other Ambulatory Visit: Payer: Self-pay | Admitting: Physician Assistant

## 2023-01-17 DIAGNOSIS — I10 Essential (primary) hypertension: Secondary | ICD-10-CM

## 2023-01-19 ENCOUNTER — Other Ambulatory Visit: Payer: Self-pay | Admitting: Obstetrics and Gynecology

## 2023-01-19 DIAGNOSIS — Z1231 Encounter for screening mammogram for malignant neoplasm of breast: Secondary | ICD-10-CM

## 2023-01-19 NOTE — Telephone Encounter (Signed)
Called patient to confirm she has enough medication until next delivery. Patient needs appt scheduled for medication refills. No answer, LVMTCB [phone].

## 2023-01-19 NOTE — Telephone Encounter (Signed)
Requested by interface surescripts. Courtesy refill. No future appt . Called patient to schedule appt no answer, LVMTCB . Requested Prescriptions  Pending Prescriptions Disp Refills   metoprolol succinate (TOPROL-XL) 50 MG 24 hr tablet [Pharmacy Med Name: METOPROLOL SUCCINATE ER TABS 50MG ] 90 tablet 0    Sig: TAKE 1 TABLET ONCE DAILY WITH OR IMMEDIATELY FOLLOWING A MEAL     Cardiovascular:  Beta Blockers Failed - 01/17/2023  1:11 AM      Failed - Last BP in normal range    BP Readings from Last 1 Encounters:  04/18/22 (!) 134/95         Failed - Valid encounter within last 6 months    Recent Outpatient Visits           9 months ago Lip swelling   [place] [facility] [place], [place], [place]   11 months ago Essential hypertension   [place] [facility] [address], [place], PA-C   1 year ago Essential hypertension   [facility] [place], [place], PA-C   1 year ago Essential hypertension   [place] [facility] [place], [place], [place]   3 years ago Essential hypertension   [place] [facility] Bosie Clos, MD              Passed - Last Heart Rate in normal range    Pulse Readings from Last 1 Encounters:  04/18/22 99

## 2023-02-14 ENCOUNTER — Ambulatory Visit
Admission: RE | Admit: 2023-02-14 | Discharge: 2023-02-14 | Disposition: A | Discharge status: Home / Self Care | Payer: BC Managed Care – PPO | Source: Ambulatory Visit | Attending: Obstetrics and Gynecology | Admitting: Obstetrics and Gynecology

## 2023-02-14 DIAGNOSIS — Z1231 Encounter for screening mammogram for malignant neoplasm of breast: Secondary | ICD-10-CM | POA: Insufficient documentation

## 2023-04-17 ENCOUNTER — Other Ambulatory Visit: Payer: Self-pay | Admitting: Physician Assistant

## 2023-04-17 DIAGNOSIS — I10 Essential (primary) hypertension: Secondary | ICD-10-CM

## 2023-04-18 NOTE — Telephone Encounter (Signed)
 Left a voicemail for pt to call and schedule her yearly physical with Janna Ostwalt PA-C.  Returning med refill request to practice for provider review.

## 2023-07-18 ENCOUNTER — Other Ambulatory Visit: Payer: Self-pay | Admitting: Physician Assistant

## 2023-07-18 DIAGNOSIS — I10 Essential (primary) hypertension: Secondary | ICD-10-CM

## 2023-07-18 NOTE — Telephone Encounter (Signed)
 Requested medications are due for refill today.  yes  Requested medications are on the active medications list.  yes  Last refill. 04/20/2023 #30 0 rf  Future visit scheduled.   no  Notes to clinic.  Pt already given a courtesy refill. Pt is more than 3 months overdue for an OV.   Requested Prescriptions  Pending Prescriptions Disp Refills   metoprolol  succinate (TOPROL -XL) 50 MG 24 hr tablet [Pharmacy Med Name: METOPROLOL  SUCCINATE ER TABS 50MG ] 90 tablet 3    Sig: TAKE 1 TABLET DAILY WITH OR IMMEDIATELY FOLLOWING A MEAL (COURTESY REFILL GIVEN, NEED AN APPOINTMENT FOR FURTHER REFILLS)     Cardiovascular:  Beta Blockers Failed - 07/18/2023  5:35 PM      Failed - Last BP in normal range    BP Readings from Last 1 Encounters:  04/18/22 (!) 134/95         Failed - Valid encounter within last 6 months    Recent Outpatient Visits   None            Passed - Last Heart Rate in normal range    Pulse Readings from Last 1 Encounters:  04/18/22 99

## 2023-10-19 ENCOUNTER — Ambulatory Visit: Payer: Self-pay

## 2023-10-19 ENCOUNTER — Encounter: Payer: Self-pay | Admitting: Family Medicine

## 2023-10-19 ENCOUNTER — Ambulatory Visit: Admitting: Family Medicine

## 2023-10-19 VITALS — BP 160/85 | HR 80 | Ht 66.0 in | Wt 159.9 lb

## 2023-10-19 DIAGNOSIS — F3342 Major depressive disorder, recurrent, in full remission: Secondary | ICD-10-CM

## 2023-10-19 DIAGNOSIS — F419 Anxiety disorder, unspecified: Secondary | ICD-10-CM | POA: Diagnosis not present

## 2023-10-19 DIAGNOSIS — R030 Elevated blood-pressure reading, without diagnosis of hypertension: Secondary | ICD-10-CM | POA: Insufficient documentation

## 2023-10-19 DIAGNOSIS — N3091 Cystitis, unspecified with hematuria: Secondary | ICD-10-CM | POA: Diagnosis not present

## 2023-10-19 DIAGNOSIS — G243 Spasmodic torticollis: Secondary | ICD-10-CM

## 2023-10-19 DIAGNOSIS — R300 Dysuria: Secondary | ICD-10-CM | POA: Diagnosis not present

## 2023-10-19 LAB — POCT URINALYSIS DIPSTICK
Bilirubin, UA: NEGATIVE
Glucose, UA: NEGATIVE
Ketones, UA: NEGATIVE
Nitrite, UA: NEGATIVE
Protein, UA: NEGATIVE
Spec Grav, UA: <=1.005 — AB (ref 1.010–1.025)
Urobilinogen, UA: 0.2 U/dL
pH, UA: 6 (ref 5.0–8.0)

## 2023-10-19 MED ORDER — ALPRAZOLAM 0.25 MG PO TABS: 0.2500 mg | ORAL_TABLET | Freq: Two times a day (BID) | ORAL | 0 refills | Status: AC | PRN

## 2023-10-19 MED ORDER — CYCLOBENZAPRINE HCL 10 MG PO TABS: 10.0000 mg | ORAL_TABLET | Freq: Every day | ORAL | 0 refills | Status: AC

## 2023-10-19 MED ORDER — NITROFURANTOIN MONOHYD MACRO 100 MG PO CAPS
100.0000 mg | ORAL_CAPSULE | Freq: Two times a day (BID) | ORAL | 0 refills | Status: AC
Start: 2023-10-19 — End: 2023-10-24

## 2023-10-19 NOTE — Progress Notes (Signed)
 ACUTE VISIT   Patient: Catherine Herrera   DOB: [birth year]   58 y.o. Female  MRN: [id number]   PCP: Ostwalt, Janna, PA-C  Chief Complaint  Patient presents with   Dysuria    Odor to urine, pressure, hematuria and some burning.   Medication Refill    Patient needs refills on Alprazolam , Flexeril , Metoprolol     Subjective    HPI HPI     Dysuria    Additional comments: Odor to urine, pressure, hematuria and some burning.        Medication Refill    Additional comments: Patient needs refills on Alprazolam , Flexeril , Metoprolol        Last edited by Rosas, Joseline E, CMA on 10/19/2023  1:43 PM.       Discussed the use of AI scribe software for clinical note transcription with the patient, who gave verbal consent to proceed.  History of Present Illness Catherine Herrera is a 58 year old female who presents with urinary symptoms and hematuria.  She began experiencing urinary symptoms yesterday, including a foul odor and a burning sensation towards the end of urination. As the day progressed, she noticed increased frequency and pressure to urinate. This morning, she observed blood in her urine, which occurred again at work but not in subsequent urinations. She has a history of kidney infection and kidney stones, though she did not require any procedures for the stones.  Her anxiety tends to elevate her blood pressure during medical visits. She is not currently taking her anxiety medication, alprazolam , as her prescription ran out. She describes her anxiety as 'pretty high' and 'an all day, every day thing.' She takes metoprolol  50 mg for blood pressure management and has not been taking her muscle relaxer, Flexeril , for cervical dystonia due to running out of the medication.  She has a family history of thyroid  issues, with her mother and sister having thyroid  problems. She recalls a past blood workup suggesting potential thyroid  issues, but subsequent evaluations  did not confirm this.  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 Her husband has been in and out of the hospital, adding to her responsibilities.     Medications: Outpatient Medications Prior to Visit  Medication Sig   metoprolol  succinate (TOPROL -XL) 50 MG 24 hr tablet TAKE 1 TABLET DAILY WITH OR IMMEDIATELY FOLLOWING A MEAL (COURTESY REFILL GIVEN, NEED AN APPOINTMENT FOR FURTHER REFILLS)   [DISCONTINUED] ALPRAZolam  (XANAX ) 0.25 MG tablet Take 1 tablet (0.25 mg total) by mouth 2 (two) times daily as needed. for anxiety   [DISCONTINUED] cyclobenzaprine  (FLEXERIL ) 10 MG tablet Take 1 tablet (10 mg total) by mouth at bedtime.   [DISCONTINUED] metoprolol  succinate (TOPROL -XL) 50 MG 24 hr tablet TAKE 1 TABLET BY MOUTH ONCE DAILY TAKE  WITH  OR  IMMEDIATELY  FOLLOWING  A  MEAL   [DISCONTINUED] amoxicillin -clavulanate (AUGMENTIN ) 875-125 MG tablet Take 1 tablet by mouth 2 (two) times daily.   [DISCONTINUED] hydrOXYzine  (VISTARIL ) 50 MG capsule Start taking 1 tab at bedtime and add second tab AM if symptoms will not be controlled.   [DISCONTINUED] predniSONE  (DELTASONE ) 20 MG tablet Take 1 tablet (20 mg total) by mouth daily with breakfast. Please, watch for BP, please, measure and records daily.   No facility-administered medications prior to visit.        Objective    BP (!) 160/85 (BP Location: Right Arm,  Patient Position: Sitting, Cuff Size: Normal)   Pulse 80   Ht 5' 6 (1.676 m)   Wt 159 lb 14.4 oz (72.5 kg)   LMP 12/06/2016   SpO2 100%   BMI 25.81 kg/m    Physical Exam   Physical Exam VITALS: BP- 166/84 GENERAL: Afebrile. ABDOMEN: No costovertebral angle tenderness bilaterally, no suprapubic tenderness, no abdominal distention, normal bowel sounds.   Results for orders placed or performed in visit on 10/19/23  POCT urinalysis dipstick  Result Value Ref Range   Color, UA Light Yellow     Clarity, UA Clear    Glucose, UA Negative Negative   Bilirubin, UA Negative    Ketones, UA Negative    Spec Grav, UA <=1.005 (A) 1.010 - 1.025   Blood, UA Moderate    pH, UA 6.0 5.0 - 8.0   Protein, UA Negative Negative   Urobilinogen, UA 0.2 0.2 or 1.0 E.U./dL   Nitrite, UA Negative    Leukocytes, UA Moderate (2+) (A) Negative   Appearance     Odor      Assessment & Plan     Assessment and Plan Assessment & Plan Urinary tract infection with hematuria Acute onset of urinary symptoms including foul odor, increased frequency, urgency, and dysuria, with gross hematuria. Differential diagnosis includes UTI and possible kidney involvement. Absence of CVA and suprapubic tenderness suggests lower urinary tract involvement. - Order urine culture and microscopy to identify causative organism and confirm hematuria. - Prescribe Macrobid  100 mg twice daily for 5 days. - Adjust antibiotic based on urine culture results.  Spasmodic torticollis (cervical dystonia) Chronic cervical dystonia managed with Flexeril . No recent exacerbation reported. - Prescribe Flexeril  10 mg at bedtime.  Anxiety disorder Chronic anxiety exacerbated in medical settings. Previously managed with alprazolam  as needed. No current panic attacks, but persistent anxiety present. - Prescribe alprazolam  0.25 mg twice daily as needed. - Schedule follow-up in 3 months to reassess anxiety management with PCP   Elevated blood pressure without diagnosis of hypertension Blood pressure elevated at 166/84, attributed to anxiety. No symptoms of hypertensive urgency. Managed with metoprolol . - Recheck blood pressure during visit. - continue metoprolol  50mg  daily  - Order comprehensive metabolic panel to assess kidney function. - Order TSH and T4 to evaluate thyroid  function.  General Health Maintenance Routine health maintenance discussed. She is due for a Pap smear this year based on new guidelines. - Advise to schedule Pap  smear with gynecologist.  Follow-Up Follow-up plan to ensure continuity of care and reassessment of current conditions. - Schedule follow-up appointment in 3 months with Jonna. - Advise to monitor MyChart for lab results and further instructions.      Return in about 3 months (around 01/18/2024) for HTN, Anxiety.        Rockie Agent, MD  [facility] [phone] (phone) [phone] (fax)  [facility]

## 2023-10-19 NOTE — Addendum Note (Signed)
 Addended by: Daltyn Degroat E on: 10/19/2023 02:38 PM   Modules accepted: Orders

## 2023-10-19 NOTE — Telephone Encounter (Signed)
 FYI Only or Action Required?: Action required by provider: request for appointment.  Patient was last seen in primary care on .  Called Nurse Triage reporting Urinary symptoms .  Symptoms began yesterday.  Interventions attempted: Rest, hydration, or home remedies.  Symptoms are: unchanged.   Triage Disposition: See Physician Within 24 Hours  Patient/caregiver understands and will follow disposition?: Yes   Copied from CRM #[id number]. Topic: Clinical - Red Word Triage >> Oct 19, 2023  8:03 AM Mia F wrote: Red Word that prompted transfer to Nurse Triage: Possible uti/kidney infection. Frequent urination. smelly urine, burning sensation, some blood, pressure. Been going on for a day. Reason for Disposition  Urinating more frequently than usual (i.e., frequency) OR new-onset of the feeling of an urgent need to urinate (i.e., urgency)  Answer Assessment - Initial Assessment Questions 1. SYMPTOM: What's the main symptom you're concerned about? (e.g., frequency, incontinence)     Pressure, odor, burning 2. ONSET: When did the    start?     yesterday 3. PAIN: Is there any pain? If Yes, ask: How bad is it? (Scale: 1-10; mild, moderate, severe)     MODERATE 4. CAUSE: What do you think is causing the symptoms?     UTI 5. OTHER SYMPTOMS: Do you have any other symptoms? (e.g., blood in urine, fever, flank pain, pain with urination)     blood 6. PREGNANCY: Is there any chance you are pregnant? When was your last menstrual period?     no  Protocols used: Urinary Symptoms-A-AH

## 2023-10-19 NOTE — Telephone Encounter (Signed)
 Patient call note and symptoms reviewed. Agree with scheduled appt. Will evaluate during OV

## 2023-10-20 ENCOUNTER — Ambulatory Visit: Payer: Self-pay | Admitting: Family Medicine

## 2023-10-20 LAB — BMP8+EGFR
BUN/Creatinine Ratio: 8 — ABNORMAL LOW (ref 9–23)
BUN: 8 mg/dL (ref 6–24)
CO2: 23 mmol/L (ref 20–29)
Calcium: 9.9 mg/dL (ref 8.7–10.2)
Chloride: 103 mmol/L (ref 96–106)
Creatinine, Ser: 1.06 mg/dL — ABNORMAL HIGH (ref 0.57–1.00)
Glucose: 94 mg/dL (ref 70–99)
Potassium: 4.1 mmol/L (ref 3.5–5.2)
Sodium: 141 mmol/L (ref 134–144)
eGFR: 61 mL/min/1.73 (ref >59)

## 2023-10-20 LAB — TSH+FREE T4
Free T4: 1.15 ng/dL (ref 0.82–1.77)
TSH: 1.6 u[IU]/mL (ref 0.450–4.500)

## 2023-10-20 LAB — URINALYSIS, MICROSCOPIC ONLY
Bacteria, UA: NONE SEEN
Casts: NONE SEEN /LPF
RBC, Urine: NONE SEEN /HPF (ref 0–2)
WBC, UA: >30 /HPF — AB (ref 0–5)

## 2023-10-24 LAB — URINE CULTURE

## 2023-10-24 NOTE — Telephone Encounter (Signed)
 Spoke with patient and relayed results. Nothing further needed at this time.

## 2023-12-05 ENCOUNTER — Encounter: Payer: Self-pay | Admitting: Physician Assistant

## 2023-12-05 ENCOUNTER — Ambulatory Visit (INDEPENDENT_AMBULATORY_CARE_PROVIDER_SITE_OTHER): Admitting: Physician Assistant

## 2023-12-05 VITALS — BP 176/87 | HR 104 | Temp 98.1°F | Resp 14 | Ht 66.0 in | Wt 157.3 lb

## 2023-12-05 DIAGNOSIS — I10 Essential (primary) hypertension: Secondary | ICD-10-CM | POA: Diagnosis not present

## 2023-12-05 DIAGNOSIS — R052 Subacute cough: Secondary | ICD-10-CM

## 2023-12-05 MED ORDER — AZITHROMYCIN 250 MG PO TABS: ORAL_TABLET | ORAL | 0 refills | Status: AC

## 2023-12-05 MED ORDER — BENZONATATE 200 MG PO CAPS: 200.0000 mg | ORAL_CAPSULE | Freq: Two times a day (BID) | ORAL | 0 refills | Status: AC | PRN

## 2023-12-05 MED ORDER — IPRATROPIUM BROMIDE 0.03 % NA SOLN: 2.0000 | Freq: Two times a day (BID) | NASAL | 0 refills | Status: AC

## 2023-12-05 NOTE — Progress Notes (Signed)
 Established patient visit  Patient: Catherine Herrera   DOB: 08/28/1965   58 y.o. Female  MRN: [id number] Visit Date: 12/05/2023  Today's healthcare provider: Jolynn Spencer, PA-C   Chief Complaint  Patient presents with   Sinus Problem    Sinus headache, fatigue, congestion, cough onset 2-3 weeks  Otc: mucinex, dayquil   Subjective     Discussed the use of AI scribe software for clinical note transcription with the patient, who gave verbal consent to proceed.  History of Present Illness Catherine Herrera is a 58 year old female who presents with sinus pain, headache, congestion, and cough for two to three weeks.  She initially experienced a sinus headache and fatigue, with a lack of appetite. Four to five days later, she developed a cough and congestion. Despite using DayQuil initially and Mucinex for the past two weeks, she continues to have yellow nasal discharge and a persistent cough. The congestion and cough have not fully resolved, and she experiences chest soreness when coughing.  There is no facial pressure or pain, and her breathing is unaffected. The cough causes soreness under her ribs, likely due to the act of coughing. There is no significant post-nasal drainage or neck pressure.  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 She is cautious about illness prevention.  She takes metoprolol  and aspirin for high blood pressure and experiences anxiety during medical visits, which she believes affects her blood pressure readings.       10/19/2023    1:42 PM 04/18/2022    1:13 PM 01/25/2022    2:57 PM  Depression screen PHQ 2/9  Decreased Interest 0 0 0  Down, Depressed, Hopeless 2 0 1  PHQ - 2 Score 2 0 1  Altered sleeping 2 3 1   Tired, decreased energy 1 2 1   Change in appetite 0 0 0  Feeling bad or failure about yourself  0 0 0  Trouble concentrating 0 0 0  Moving slowly or fidgety/restless 0 0 0  Suicidal thoughts 0 0 0   PHQ-9 Score 5 5 3   Difficult doing work/chores Not difficult at all Not difficult at all Not difficult at all      10/19/2023    1:42 PM 01/25/2022    2:59 PM 12/28/2021    3:39 PM 12/10/2019    3:47 PM  GAD 7 : Generalized Anxiety Score  Nervous, Anxious, on Edge 3 2 3 1   Control/stop worrying 2 0 2 0  Worry too much - different things 1 1 2  0  Trouble relaxing 1 1 1 1   Restless 0 0 0 0  Easily annoyed or irritable 1 1 1  0  Afraid - awful might happen 0 0 0 0  Total GAD 7 Score 8 5 9 2   Anxiety Difficulty Not difficult at all Not difficult at all Not difficult at all Not difficult at all    Medications: Outpatient Medications Prior to Visit  Medication Sig   ALPRAZolam  (XANAX ) 0.25 MG tablet Take 1 tablet (0.25 mg total) by mouth 2 (two) times daily as needed. for anxiety   aspirin EC 81 MG tablet Take 81 mg by mouth daily. Swallow whole.   cyclobenzaprine  (FLEXERIL ) 10 MG tablet Take 1 tablet (10 mg total) by mouth at bedtime.   metoprolol  succinate (TOPROL -XL) 50 MG 24 hr tablet TAKE 1 TABLET DAILY WITH OR IMMEDIATELY FOLLOWING A MEAL (COURTESY REFILL GIVEN, NEED AN APPOINTMENT FOR FURTHER REFILLS)  No facility-administered medications prior to visit.    Review of Systems All negative Except see HPI       Objective    BP (!) 176/87   Pulse (!) 104   Temp 98.1 F (36.7 C) (Oral)   Resp 14   Ht 5' 6 (1.676 m)   Wt 157 lb 4.8 oz (71.4 kg)   LMP 12/06/2016   SpO2 100%   BMI 25.39 kg/m     Physical Exam Vitals reviewed.  Constitutional:      Appearance: She is normal weight.  HENT:     Head: Normocephalic and atraumatic.     Right Ear: Ear canal and external ear normal.     Left Ear: Ear canal and external ear normal.     Nose: Congestion (mild) and rhinorrhea (mild) present.     Mouth/Throat:     Pharynx: No posterior oropharyngeal erythema.     Comments: Postnasal drainage noted Eyes:     General: No scleral icterus.       Right eye: No discharge.         Left eye: No discharge.     Extraocular Movements: Extraocular movements intact.     Pupils: Pupils are equal, round, and reactive to light.  Cardiovascular:     Rate and Rhythm: Normal rate and regular rhythm.  Pulmonary:     Effort: Pulmonary effort is normal.     Breath sounds: Normal breath sounds.  Abdominal:     General: Abdomen is flat. Bowel sounds are normal.     Palpations: Abdomen is soft.  Lymphadenopathy:     Cervical: No cervical adenopathy.  Neurological:     Mental Status: She is alert.      No results found for any visits on 12/05/23.      Assessment & Plan Subacute sinusitis with persistent cough and congestion Persistent cough and congestion for three weeks, unresponsive to OTC medications. Differential includes viral infection and possible allergy. - Prescribed Tessalon twice daily for cough. - Continue Mucinex; discontinue if Tessalon effective. - Prescribed nasal saline spray and Flonase. - Consider antihistamines: Allegra, Claritin, Zyrtec. - Start antibiotics if symptomatic treatment will be unsuccesful RTC if symptoms persist.  Hypertension Chronic and unstable Elevated blood pressure, possibly anxiety-related. Current regimen includes metoprolol    Blood pressure management critical to prevent complications. - Advise home blood pressure monitoring; bring readings to next appointment. - Increase metoprolol  to twice daily if BP readings exceed 150 mmHg. If BP readings >150/90 RTC - Schedule follow-up in December. - Consider counseling referral for anxiety management. - Consider lifestyle modifications Will follow-up     No orders of the defined types were placed in this encounter.   No follow-ups on file.   The patient was advised to call back or seek an in-person evaluation if the symptoms worsen or if the condition fails to improve as anticipated.  I discussed the assessment and treatment plan with the patient. The patient was  provided an opportunity to ask questions and all were answered. The patient agreed with the plan and demonstrated an understanding of the instructions.  I, Eevee Borbon, PA-C have reviewed all documentation for this visit. The documentation on 12/05/2023  for the exam, diagnosis, procedures, and orders are all accurate and complete.  Jolynn Spencer, [facility], MMS [facility] [phone] (phone) [phone] (fax)  [facility]

## 2024-01-22 ENCOUNTER — Ambulatory Visit: Admitting: Physician Assistant

## 2024-03-03 NOTE — Progress Notes (Unsigned)
 " Established patient visit  Patient: Catherine Herrera   DOB: 12-28-1965   59 y.o. Female  MRN: [id number] Visit Date: 03/05/2024  Today's healthcare provider: Jolynn Spencer, PA-C   No chief complaint on file.  Subjective       Discussed the use of AI scribe software for clinical note transcription with the patient, who gave verbal consent to proceed.  History of Present Illness        10/19/2023    1:42 PM 04/18/2022    1:13 PM 01/25/2022    2:57 PM  Depression screen PHQ 2/9  Decreased Interest 0 0 0  Down, Depressed, Hopeless 2 0 1  PHQ - 2 Score 2 0 1  Altered sleeping 2 3 1   Tired, decreased energy 1 2 1   Change in appetite 0 0 0  Feeling bad or failure about yourself  0 0 0  Trouble concentrating 0 0 0  Moving slowly or fidgety/restless 0 0 0  Suicidal thoughts 0 0 0  PHQ-9 Score 5  5  3    Difficult doing work/chores Not difficult at all Not difficult at all Not difficult at all     Data saved with a previous flowsheet row definition      10/19/2023    1:42 PM 01/25/2022    2:59 PM 12/28/2021    3:39 PM 12/10/2019    3:47 PM  GAD 7 : Generalized Anxiety Score  Nervous, Anxious, on Edge 3 2 3 1   Control/stop worrying 2 0 2 0  Worry too much - different things 1 1 2  0  Trouble relaxing 1 1 1 1   Restless 0 0 0 0  Easily annoyed or irritable 1 1 1  0  Afraid - awful might happen 0 0 0 0  Total GAD 7 Score 8 5 9 2   Anxiety Difficulty Not difficult at all Not difficult at all Not difficult at all Not difficult at all    Medications: Show/hide medication list[1]  Review of Systems  All other systems reviewed and are negative.  All negative Except see HPI   {Insert previous labs (optional):23779} {See past labs  Heme  Chem  Endocrine  Serology  Results Review (optional):1}   Objective    LMP 12/06/2016  {Insert last BP/Wt (optional):23777}{See vitals history (optional):1}   Physical Exam Vitals reviewed.  Constitutional:      General: She is  not in acute distress.    Appearance: Normal appearance. She is well-developed. She is not diaphoretic.  HENT:     Head: Normocephalic and atraumatic.  Eyes:     General: No scleral icterus.    Conjunctiva/sclera: Conjunctivae normal.  Neck:     Thyroid : No thyromegaly.  Cardiovascular:     Rate and Rhythm: Normal rate and regular rhythm.     Pulses: Normal pulses.     Heart sounds: Normal heart sounds. No murmur heard. Pulmonary:     Effort: Pulmonary effort is normal. No respiratory distress.     Breath sounds: Normal breath sounds. No wheezing, rhonchi or rales.  Musculoskeletal:     Cervical back: Neck supple.     Right lower leg: No edema.     Left lower leg: No edema.  Lymphadenopathy:     Cervical: No cervical adenopathy.  Skin:    General: Skin is warm and dry.     Findings: No rash.  Neurological:     Mental Status: She is alert and oriented to person, place, and time. Mental status  is at baseline.  Psychiatric:        Mood and Affect: Mood normal.        Behavior: Behavior normal.     No results found for any visits on 03/05/24.      Assessment and Plan Assessment & Plan     No orders of the defined types were placed in this encounter.   No follow-ups on file.   The patient was advised to call back or seek an in-person evaluation if the symptoms worsen or if the condition fails to improve as anticipated.  I discussed the assessment and treatment plan with the patient. The patient was provided an opportunity to ask questions and all were answered. The patient agreed with the plan and demonstrated an understanding of the instructions.  I, Redina Zeller, PA-C have reviewed all documentation for this visit. The documentation on 03/05/2024  for the exam, diagnosis, procedures, and orders are all accurate and complete.  Jolynn Spencer, [facility], MMS [facility] [phone] (phone) [phone] (fax)  [facility]      [1] Outpatient Medications Prior to Visit  Medication Sig   ALPRAZolam  (XANAX ) 0.25 MG tablet Take 1 tablet (0.25 mg total) by mouth 2 (two) times daily as needed. for anxiety   aspirin EC 81 MG tablet Take 81 mg by mouth daily. Swallow whole.   benzonatate  (TESSALON ) 200 MG capsule Take 1 capsule (200 mg total) by mouth 2 (two) times daily as needed for cough.   cyclobenzaprine  (FLEXERIL ) 10 MG tablet Take 1 tablet (10 mg total) by mouth at bedtime.   ipratropium (ATROVENT ) 0.03 % nasal spray Place 2 sprays into both nostrils 2 (two) times daily for 3 days.   metoprolol  succinate (TOPROL -XL) 50 MG 24 hr tablet TAKE 1 TABLET DAILY WITH OR IMMEDIATELY FOLLOWING A MEAL (COURTESY REFILL GIVEN, NEED AN APPOINTMENT FOR FURTHER REFILLS)   No facility-administered medications prior to visit.  "

## 2024-03-05 ENCOUNTER — Ambulatory Visit: Admitting: Physician Assistant

## 2024-03-05 DIAGNOSIS — F3342 Major depressive disorder, recurrent, in full remission: Secondary | ICD-10-CM

## 2024-03-05 DIAGNOSIS — I10 Essential (primary) hypertension: Secondary | ICD-10-CM

## 2024-03-05 DIAGNOSIS — F419 Anxiety disorder, unspecified: Secondary | ICD-10-CM

## 2024-03-05 DIAGNOSIS — G243 Spasmodic torticollis: Secondary | ICD-10-CM
